# Patient Record
Sex: Female | Born: 1975 | Race: White | Hispanic: Yes | Marital: Married | Smoking: Never smoker
Health system: Southern US, Community
[De-identification: ages and names within clinical notes are randomized; demographics above are authoritative.]

## PROBLEM LIST (undated history)

## (undated) ENCOUNTER — Inpatient Hospital Stay (HOSPITAL_COMMUNITY): Payer: Self-pay

## (undated) DIAGNOSIS — Z789 Other specified health status: Secondary | ICD-10-CM

## (undated) HISTORY — PX: LAPAROSCOPIC ENDOMETRIOSIS FULGURATION: SUR769

## (undated) HISTORY — PX: OOPHORECTOMY: SHX86

---

## 2000-03-27 ENCOUNTER — Emergency Department (HOSPITAL_COMMUNITY): Admission: EM | Admit: 2000-03-27 | Discharge: 2000-03-27 | Payer: Self-pay | Admitting: Emergency Medicine

## 2002-01-10 ENCOUNTER — Inpatient Hospital Stay (HOSPITAL_COMMUNITY): Admission: AD | Admit: 2002-01-10 | Discharge: 2002-01-10 | Payer: Self-pay | Admitting: *Deleted

## 2008-06-23 ENCOUNTER — Emergency Department (HOSPITAL_COMMUNITY): Admission: EM | Admit: 2008-06-23 | Discharge: 2008-06-23 | Payer: Self-pay | Admitting: Emergency Medicine

## 2008-08-22 ENCOUNTER — Inpatient Hospital Stay (HOSPITAL_COMMUNITY): Admission: AD | Admit: 2008-08-22 | Discharge: 2008-08-22 | Payer: Self-pay | Admitting: Obstetrics & Gynecology

## 2008-09-20 ENCOUNTER — Ambulatory Visit: Payer: Self-pay | Admitting: Obstetrics and Gynecology

## 2008-09-20 LAB — CONVERTED CEMR LAB: Anticardiolipin IgA: 26 (ref <13)

## 2008-10-04 ENCOUNTER — Ambulatory Visit: Payer: Self-pay | Admitting: Family Medicine

## 2008-10-05 ENCOUNTER — Encounter: Payer: Self-pay | Admitting: Obstetrics and Gynecology

## 2008-10-05 LAB — CONVERTED CEMR LAB
AntiThromb III Func: 99 % (ref 76–126)
Anticardiolipin IgM: <7 (ref <10)
HCT: 33.2 % — ABNORMAL LOW (ref 36.0–46.0)
Homocysteine: 7.2 micromoles/L (ref 4.0–15.4)
MCHC: 30.1 g/dL (ref 30.0–36.0)
MCV: 87.4 fL (ref 78.0–100.0)
Platelets: 321 10*3/uL (ref 150–400)
Protein S Ag, Total: 156 % — ABNORMAL HIGH (ref 70–140)
RBC: 3.8 M/uL — ABNORMAL LOW (ref 3.87–5.11)
WBC: 6.1 10*3/uL (ref 4.0–10.5)

## 2008-10-25 ENCOUNTER — Ambulatory Visit: Payer: Self-pay | Admitting: Obstetrics and Gynecology

## 2008-12-20 ENCOUNTER — Encounter: Payer: Self-pay | Admitting: Obstetrics & Gynecology

## 2008-12-20 ENCOUNTER — Ambulatory Visit: Payer: Self-pay | Admitting: Obstetrics and Gynecology

## 2008-12-22 ENCOUNTER — Encounter: Payer: Self-pay | Admitting: Physician Assistant

## 2009-01-10 ENCOUNTER — Ambulatory Visit: Payer: Self-pay | Admitting: Obstetrics & Gynecology

## 2009-02-16 ENCOUNTER — Emergency Department (HOSPITAL_COMMUNITY): Admission: EM | Admit: 2009-02-16 | Discharge: 2009-02-16 | Payer: Self-pay | Admitting: Emergency Medicine

## 2009-02-27 ENCOUNTER — Encounter: Payer: Self-pay | Admitting: Obstetrics & Gynecology

## 2009-02-28 ENCOUNTER — Ambulatory Visit: Payer: Self-pay | Admitting: Obstetrics & Gynecology

## 2009-02-28 ENCOUNTER — Ambulatory Visit (HOSPITAL_COMMUNITY): Admission: RE | Admit: 2009-02-28 | Discharge: 2009-02-28 | Payer: Self-pay | Admitting: Obstetrics & Gynecology

## 2010-01-31 ENCOUNTER — Encounter: Payer: Self-pay | Admitting: Family Medicine

## 2010-01-31 ENCOUNTER — Ambulatory Visit (HOSPITAL_COMMUNITY): Admission: AD | Admit: 2010-01-31 | Discharge: 2010-01-31 | Payer: Self-pay | Admitting: Family Medicine

## 2010-01-31 ENCOUNTER — Ambulatory Visit: Payer: Self-pay | Admitting: Obstetrics & Gynecology

## 2010-02-10 DEATH — deceased

## 2010-02-14 ENCOUNTER — Ambulatory Visit: Payer: Self-pay | Admitting: Obstetrics and Gynecology

## 2010-09-27 LAB — CBC
HCT: 34.3 % — ABNORMAL LOW (ref 36.0–46.0)
MCH: 30.3 pg (ref 26.0–34.0)
MCV: 90.1 fL (ref 78.0–100.0)
RBC: 3.81 MIL/uL — ABNORMAL LOW (ref 3.87–5.11)
WBC: 7.1 10*3/uL (ref 4.0–10.5)

## 2010-09-27 LAB — DIFFERENTIAL
Basophils Relative: 0 % (ref 0–1)
Lymphocytes Relative: 19 % (ref 12–46)
Monocytes Absolute: 0.5 10*3/uL (ref 0.1–1.0)
Neutro Abs: 5.1 10*3/uL (ref 1.7–7.7)
Neutrophils Relative %: 73 % (ref 43–77)

## 2010-09-27 LAB — URINALYSIS, ROUTINE W REFLEX MICROSCOPIC: Glucose, UA: NEGATIVE mg/dL

## 2010-09-27 LAB — URINE CULTURE

## 2010-09-27 LAB — URINE MICROSCOPIC-ADD ON

## 2010-09-27 LAB — GC/CHLAMYDIA PROBE AMP, GENITAL: Chlamydia, DNA Probe: NEGATIVE

## 2010-09-27 LAB — WET PREP, GENITAL

## 2010-10-18 LAB — COMPREHENSIVE METABOLIC PANEL
ALT: 9 U/L (ref 0–35)
Albumin: 3.9 g/dL (ref 3.5–5.2)
BUN: 9 mg/dL (ref 6–23)
Chloride: 110 mEq/L (ref 96–112)
GFR calc Af Amer: >60 mL/min (ref >60)
GFR calc non Af Amer: >60 mL/min (ref >60)
Glucose, Bld: 97 mg/dL (ref 70–99)
Potassium: 3 mEq/L — ABNORMAL LOW (ref 3.5–5.1)
Sodium: 138 mEq/L (ref 135–145)
Total Protein: 7.2 g/dL (ref 6.0–8.3)

## 2010-10-18 LAB — CBC
HCT: 31.4 % — ABNORMAL LOW (ref 36.0–46.0)
HCT: 29.7 % — ABNORMAL LOW (ref 36.0–46.0)
Hemoglobin: 10.5 g/dL — ABNORMAL LOW (ref 12.0–15.0)
Hemoglobin: 9.8 g/dL — ABNORMAL LOW (ref 12.0–15.0)
MCHC: 33.3 g/dL (ref 30.0–36.0)
MCHC: 32.8 g/dL (ref 30.0–36.0)
RBC: 4.01 MIL/uL (ref 3.87–5.11)
RDW: 16.2 % — ABNORMAL HIGH (ref 11.5–15.5)

## 2010-10-18 LAB — DIFFERENTIAL
Eosinophils Absolute: 0 10*3/uL (ref 0.0–0.7)
Lymphs Abs: 1.5 10*3/uL (ref 0.7–4.0)
Monocytes Absolute: 0.4 10*3/uL (ref 0.1–1.0)
Monocytes Relative: 7 % (ref 3–12)
Neutro Abs: 3.2 10*3/uL (ref 1.7–7.7)

## 2010-10-18 LAB — PREGNANCY, URINE: Preg Test, Ur: NEGATIVE

## 2010-10-18 LAB — CK TOTAL AND CKMB (NOT AT ARMC): Relative Index: INVALID (ref 0.0–2.5)

## 2010-10-18 LAB — GLUCOSE, CAPILLARY: Glucose-Capillary: 89 mg/dL (ref 70–99)

## 2010-10-23 LAB — POCT URINALYSIS DIP (DEVICE)
Bilirubin Urine: NEGATIVE
Ketones, ur: NEGATIVE mg/dL
Protein, ur: NEGATIVE mg/dL
pH: 5 (ref 5.0–8.0)

## 2010-10-28 LAB — WET PREP, GENITAL: Clue Cells Wet Prep HPF POC: NONE SEEN

## 2010-10-28 LAB — CBC
MCV: 89.2 fL (ref 78.0–100.0)
Platelets: 257 10*3/uL (ref 150–400)
WBC: 8.9 10*3/uL (ref 4.0–10.5)

## 2010-10-28 LAB — GC/CHLAMYDIA PROBE AMP, GENITAL
Chlamydia, DNA Probe: NEGATIVE
GC Probe Amp, Genital: NEGATIVE

## 2010-10-28 LAB — URINE MICROSCOPIC-ADD ON

## 2010-10-28 LAB — URINALYSIS, ROUTINE W REFLEX MICROSCOPIC
Bilirubin Urine: NEGATIVE
Glucose, UA: NEGATIVE mg/dL
Specific Gravity, Urine: >1.03 — ABNORMAL HIGH (ref 1.005–1.030)
pH: 6 (ref 5.0–8.0)

## 2010-10-28 LAB — COMPREHENSIVE METABOLIC PANEL
ALT: 10 U/L (ref 0–35)
AST: 15 U/L (ref 0–37)
Albumin: 3.9 g/dL (ref 3.5–5.2)
Chloride: 109 mEq/L (ref 96–112)
Creatinine, Ser: 0.57 mg/dL (ref 0.4–1.2)
GFR calc Af Amer: >60 mL/min (ref >60)
Sodium: 138 mEq/L (ref 135–145)
Total Bilirubin: 0.4 mg/dL (ref 0.3–1.2)

## 2010-10-28 LAB — URINE CULTURE: Colony Count: 3000

## 2010-11-25 NOTE — Group Therapy Note (Signed)
NAME:  Kayla Strickland, GOLL NO.:  [id number]   MEDICAL RECORD NO.:  [id number]          PATIENT TYPE:  WOC   LOCATION:  [facility]                   FACILITY:  [facility]   PHYSICIAN:  Argentina Donovan, MD        DATE OF BIRTH:  21-Feb-1976   DATE OF SERVICE:  10/25/2008                                  CLINIC NOTE   The patient is a 35 year old Spanish-speaking Hispanic female who does  speak some English who was seen and worked up for habitual abortion and  pelvic pain.  She is a gravida 5, para 0-0-5-0, all spontaneous  miscarriages.  An ultrasound was normal with the exception of some  thickening of the anterior midportion of the uterus and had ill-defined  margins, possibly adenomyosis, but the uterus they did see was slightly  enlarged and measuring 11 x 5.6 x 9.  She had an antiphospholipid  workup, which was essentially negative with the exception of a slightly  elevated cardiolipin IgA antibody.  We are going to try on some Depo-  Provera for 3 months to see if that helps her pelvic pain.  If not, I  think laparoscopy will be carried out.  If it has helped, then I would  give her another 62-month injection as it is certainly possible this  patient does have endometriosis.   IMPRESSION:  Chronic pelvic pain of unknown etiology.  Slightly enlarged  uterus.  Habitual abortion.           ______________________________  Argentina Donovan, MD     PR/MEDQ  D:  10/25/2008  T:  10/25/2008  Job:  [id number]

## 2010-11-25 NOTE — Op Note (Signed)
NAMEMarland Kitchen  Kayla, Strickland        ACCOUNT NO.:  [id number]   MEDICAL RECORD NO.:  [id number]          PATIENT TYPE:  AMB   LOCATION:  DAY                           [facility]   PHYSICIAN:  Kayla Bossier, MD        DATE OF BIRTH:  02/24/1976   DATE OF PROCEDURE:  DATE OF DISCHARGE:                               OPERATIVE REPORT   PREOPERATIVE DIAGNOSES:  1. Chronic pelvic pain.  2. Dyspareunia.  3. Dysfunctional uterine bleeding and anemia.   POSTOPERATIVE DIAGNOSES:  1. Chronic pelvic pain.  2. Dyspareunia.  3. Dysfunctional uterine bleeding and anemia.  4. Probable endometriosis.   SURGEON:  M.C. Marice Potter, MD   ANESTHESIA:  GETR general anesthesia, Cristela Blue, MD   PROCEDURE:  1. Diagnostic laparoscopy with biopsy of posterior cul-de-sac.  2. Dilation curettage.   DETAILED PROCEDURE AND FINDINGS:  [name] is a 34 year old divorced  Hispanic, gravida 5, para 0, abortus 5, with habitual spontaneous  abortions.  She also has a 5 year history of chronic pelvic pain with  the right being worse than the left.  She also reports dyspareunia and  dysfunctional uterine bleeding.  Her bleeding has become so heavy that  her hemoglobin has dropped to 10.5.  She is currently being treated with  Depo-Provera.  Unfortunately the Depo does not make her pain any better.  She was counseled about a workup for her pain  and bleeding including a  laparoscopy and a D&C.  She understands the risks associated with  surgery and all questions were answered.  Consents were signed.  She was  taken to the operating room and placed in dorsal lithotomy position.  General anesthesia was applied without complication.  Her abdomen and  vagina were prepped and draped in the usual sterile fashion.  Her  bladder was emptied with a Robinson catheter.  Bimanual exam revealed a  mobile, anteverted uterus and nonenlarged adnexa.  Speculum was placed  in the vagina and the Hulka manipulator was placed on the  cervix for  uterine manipulation with the aid of a tenaculum.  Gloves were changed.  Attention was turned to the abdomen.  A vertical umbilical incision was  made and a Veress needle was placed intraperitoneally.  Low-flow CO2 was  used to insufflate the abdomen to approximately 3 liters.  A good  pneumoperitoneum was established.  The patient's abdominal pressure was  always less than 12.  A 10-mm trocar was placed.  Laparoscopy confirmed  correct placement.  She was placed in Trendelenburg position.  Her  pelvis was inspected.  The upper abdomen appeared normal.  The most  distinctive findings on her laparoscopy was:  1.  Normal-appearing  uterus.  2.  Normal-appearing ovaries and oviducts.  3.  Multiple  peritoneal defects certainly.  The cul-de-sac had one large perineal  defects on either side and then in the area on the outside of  uterosacral ligament, there were multiple peritoneal defects.  There was  also large dilated pelvic veins.  The ovarian fossae both appeared  entirely normal.  The edge of the peritoneal defect in the posterior cul-  de-sac on the patient's left was biopsied.  There was no bleeding from  this site.  Please note that a 5-mm ports had been placed in the left  lower quadrant under direct laparoscopic visualization, taking care to  avoid the inferior epigastric vessels.  At the completion of the  inspection of the pelvis and biopsy of the cul-de-sac, the 5-mm port was  removed.  No bleeding was noted from the site.  The CO2 was allowed to  escape from the abdomen.  Both incision sites were injected with 0.5%  Marcaine, total of 10 mL was used.  The fascia in the umbilicus was  elevated with forceps and closed with a 0 Vicryl figure-of-eight suture.  A subcuticular closure was done in both sites with a 4-0 Vicryl suture.  The tenaculum was removed and I proceeded with the D&C.  Speculum was  replaced.  Tenaculum was replaced.  The Hulka manipulator was  removed.  The cervix was gently dilated to accommodate a small curette.  Sharp  curettage was done in all quadrants of the fundus of the uterus.  A  gritty sensation was appreciated.  Only a small amount of tissue was  obtained.  (Please note, the patient has been on Depo-Provera).  Please  note, the uterus sounded to 8 cm and felt entirely normal during the  curettage process.  The tenaculum was removed and a total of 10 mL of 1%  lidocaine was injected for a paracervical block to help with postop pain  relief.  She was extubated and taken to recovery room in stable  condition.  Instrument, sponge and counts were correct.  She tolerated  the procedure well.      Kayla Bossier, MD  Electronically Signed     MCD/MEDQ  D:  02/28/2009  T:  02/28/2009  Job:  [id number]
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
## 2010-11-25 NOTE — Group Therapy Note (Signed)
NAME:  Kayla Strickland, Kayla Strickland NO.:  [id number]   MEDICAL RECORD NO.:  [id number]          PATIENT TYPE:  WOC   LOCATION:  [facility]                   FACILITY:  [facility]   PHYSICIAN:  Allie Bossier, MD        DATE OF BIRTH:  1976/05/05   DATE OF SERVICE:                                  CLINIC NOTE   Kayla Strickland is a 35 year old married Hispanic, gravida 5, para 0, abortus 5,  who has been seen here in the GYN Clinic for her diagnosis of habitual  abortion.  Workup today has been negative.  However, she is here today  as a followup for an MAU visit on August 22, 2008.  She has been  complaining of pelvic pain for approximately 5 years, it is essentially  always in the right lower quadrant.  She describes the pain is stabbing.  Ibuprofen helps the pain a little bit, but not to any great degree.  The  pain occurs every day.  She has pain with intercourse and ultrasound was  done on October 03, 2007 that showed the uterus measuring a 11 x 7 cm with  the appearance of possible adenomyosis.  There was also a septated right  ovarian cyst seen at that time.  A comparison ultrasound done at the  time of her MAU visit on August 22, 2008, showed the uterus to be  slightly smaller at 9.6 cm in its greatest dimension along with  possibility of adenomyosis.  The right and left ovaries appeared normal.  There was no free fluid in the pelvis.  Her white count at that time was  8.9 and a normal hemoglobin of 11.9.  When she was seen here in the GYN  clinic on October 25, 2008, she was seen by Dr. Tenny Craw, and he felt that she  probably has endometriosis or adenomyosis, and he went ahead and gave  her Depo-Provera injection that day to see if it helped her pain.  She  says that her pain is not any better.  She also describes that prior to  the Depo injection and certainly after the Depo injection, her periods  are abnormal.  She can bleed every day for 14 days as she has this month  or she can skip  several weeks, but she generally has never had a normal  cycle.  On exam, her cervix shows some evidence of moderate amount of  bleeding.  A Pap smear was obtained as well as cervical cultures today.   ASSESSMENT AND PLAN:  Chronic right lower quadrant pain, habitual  abortion, dysfunctional uterine bleeding, possible adenomyosis and  endometriosis.  I have recommended a laparoscopy along with a D and C.  She is excited to have workup started for her pain and bleeding and has  no questions.      Allie Bossier, MD     MCD/MEDQ  D:  12/20/2008  T:  12/21/2008  Job:  [id number]

## 2010-11-25 NOTE — Consult Note (Signed)
NAME:  Kayla Strickland, Kayla Strickland NO.:  [id number]   MEDICAL RECORD NO.:  [id number]          PATIENT TYPE:  EMS   LOCATION:  [facility]                         FACILITY:  [facility]   PHYSICIAN:  Noel Christmas, MD    DATE OF BIRTH:  Aug 29, 1975   DATE OF CONSULTATION:  02/16/2009  DATE OF DISCHARGE:  02/16/2009                                 CONSULTATION   REFERRING PHYSICIAN:  Nelva Nay, MD, [facility].   REASON FOR CONSULTATION:  Rule out TIA or stroke, presenting as code  stroke.   HISTORY OF PRESENT ILLNESS:  This is a 35 year old Hispanic lady who was  brought to the emergency room after being found in walk-in freezer at  work with a complaint of not being able to move her left arm and to a  lesser extent her left leg.  The patient stated that she felt  lightheaded and felt like she had difficulty breathing prior to going  into the freezer.  She has not had headache.  There have been no speech  changes.  No previous history of stroke or TIA.  The patient indicated  she has had problems with low blood pressure.  She is on no medications  at this point and took no medications of any type today.  CT scan of her  head was unremarkable with no signs of acute intracranial abnormality.   PAST MEDICAL HISTORY:  Remarkable for chronic pelvic pain with  dysmenorrhea as well as dyspareunia.  The patient is gravida 5, para 0,  abortus 5.   CURRENT MEDICATIONS:  None.   FAMILY HISTORY:  Noncontributory.   PHYSICAL EXAMINATION:  Appearance was that of a young lady of slender to  medium build who was alert and moderately cooperative.  She appeared to  be in no acute distress.  She was moderately etches as well as  irritable.  She was well oriented to time as well as place.  Short-term  and long-term memory were normal.  The patient has no speech deficit.  Pupils were equal and reactive normally to light.  Extraocular movements  were full and conjugate.  Visual fields  were intact and normal.  There  was no facial weakness.  She had reduced perception of all modalities of  sensation over the left side of her face compared to the right.  Hearing  was normal.  Speech was normal.  Coordination of upper extremities as  well as lower extremities was normal.  Strength was normal with good  equal purposeful movements of both upper extremities.  Deep tendon  reflexes were normal and symmetrical throughout.  Plantar responses were  flexor.  Sensory examination is normal except for reduction of  perception of all modalities involving the left side compared to the  right.  Carotid auscultation was normal.   CLINICAL IMPRESSION:  1. No objective indications of an acute focal neurological deficit      including exam, findings, and CT scan of her head.  2. Probable acute psychophysiologic reaction.   RECOMMENDATIONS:  1. No further neurodiagnostic studies are indicated at this point.  2. We will review the routine  laboratory studies when available.  3. Neurology reevaluation on a p.r.n. basis.   Thank you for asking me to evaluate Ms. Robb Matar.      Noel Christmas, MD  Electronically Signed     CS/MEDQ  D:  02/16/2009  T:  02/16/2009  Job:  [id number]

## 2011-03-11 ENCOUNTER — Other Ambulatory Visit: Payer: Self-pay | Admitting: Obstetrics

## 2011-03-11 DIAGNOSIS — O2620 Pregnancy care for patient with recurrent pregnancy loss, unspecified trimester: Secondary | ICD-10-CM

## 2011-03-23 ENCOUNTER — Ambulatory Visit (HOSPITAL_COMMUNITY)
Admission: RE | Admit: 2011-03-23 | Discharge: 2011-03-23 | Disposition: A | Discharge status: Home / Self Care | Payer: Self-pay | Source: Ambulatory Visit | Attending: Obstetrics | Admitting: Obstetrics

## 2011-03-23 ENCOUNTER — Encounter (HOSPITAL_COMMUNITY): Payer: Self-pay

## 2011-03-23 DIAGNOSIS — Z1389 Encounter for screening for other disorder: Secondary | ICD-10-CM | POA: Insufficient documentation

## 2011-03-23 DIAGNOSIS — O358XX0 Maternal care for other (suspected) fetal abnormality and damage, not applicable or unspecified: Secondary | ICD-10-CM | POA: Insufficient documentation

## 2011-03-23 DIAGNOSIS — O2620 Pregnancy care for patient with recurrent pregnancy loss, unspecified trimester: Secondary | ICD-10-CM

## 2011-03-23 DIAGNOSIS — Z363 Encounter for antenatal screening for malformations: Secondary | ICD-10-CM | POA: Insufficient documentation

## 2011-03-23 NOTE — Progress Notes (Signed)
MFM CONSULT   Patient is a 35 y/o G8 P0070 with a history of 7 first trimester SAB and a prior ectopic pregnancy.  She presents for recommendations for evaluation and management of current pregnancy.    Patient had no complaints today and was feeling well.  She reported some spotting and cramping during the first trimester of this gestation.  This has since resolved.    Patient has a history of 6 first trimester pregnancy losses.  These were all managed expectantly.  She reports confirmed cardiac activity with at least 2 of these gestations.  No records were available for review today related to these pregnancies, but the patient denies any knowledge of a prior work up for this.  She also denies any karyotype analysis for any of the POC of these gestations.  She does report being told once endometriosis or fibroids may have been related to these losses.    She has no other significant medical or surgical history.  She does report her ectopic pregnancy to have been treated with a left salpingectomy.  She is currently taking no medications other than prenatal vitamins.  There is no other significant family history for either the patient or the FOB.  With the exception of her ectopic pregnancy, all prior pregnancies have been with a prior partner.   Patient underwent an OB ultrasound today with reassuring findings and confirmation of a gestational age of [redacted] weeks (see [facility] report).  An echogenic foci was seen. No fibroids were noted on today's exam.   Options for work up of recurrent pregnancy loss was discussed today.  She elected to proceed with ACLAB work up and [employer].  These were drawn today. Maternal peripheral blood chromosome analysis was offered, but she declined this.  She may desire to consider this testing if today's evaluations are normal.  Additionally, pending the outcome of this pregnancy, HSG could be considered in this setting if these other elements of the her work up are WNL.    Reassurance was provided regarding ultrasound findings today, but periodic ultrasounds every 6-8 weeks through the remainder of the pregnancy were recommended to re evaluate fetal growth, anatomy, and amniotic fluid.    The option of amniocentesis for determination of fetal karyotype was also briefly discussed today.  Patient declined this and desires to await the results of today's tests before deciding on any further evaluations.   Questions answered.     (30 min)

## 2011-03-25 ENCOUNTER — Other Ambulatory Visit: Payer: Self-pay | Admitting: Obstetrics

## 2011-04-02 LAB — RPR: RPR: NONREACTIVE

## 2011-04-02 LAB — HEPATITIS B SURFACE ANTIGEN: Hepatitis B Surface Ag: NEGATIVE

## 2011-04-02 LAB — HIV ANTIBODY (ROUTINE TESTING W REFLEX): HIV: NONREACTIVE

## 2011-04-16 LAB — POCT I-STAT, CHEM 8
BUN: 9 mg/dL (ref 6–23)
Calcium, Ion: 1.17 mmol/L (ref 1.12–1.32)
HCT: 35 % — ABNORMAL LOW (ref 36.0–46.0)
TCO2: 24 mmol/L (ref 0–100)

## 2011-04-16 LAB — URINALYSIS, ROUTINE W REFLEX MICROSCOPIC
Glucose, UA: NEGATIVE mg/dL
pH: 6.5 (ref 5.0–8.0)

## 2011-04-16 LAB — CBC
HCT: 35 % — ABNORMAL LOW (ref 36.0–46.0)
Hemoglobin: 11.8 g/dL — ABNORMAL LOW (ref 12.0–15.0)
MCHC: 33.7 g/dL (ref 30.0–36.0)
MCV: 91.5 fL (ref 78.0–100.0)
RDW: 14 % (ref 11.5–15.5)

## 2011-04-16 LAB — URINE CULTURE

## 2011-04-16 LAB — URINE MICROSCOPIC-ADD ON

## 2011-04-16 LAB — DIFFERENTIAL
Basophils Absolute: 0 10*3/uL (ref 0.0–0.1)
Basophils Relative: 1 % (ref 0–1)
Eosinophils Relative: 1 % (ref 0–5)
Monocytes Absolute: 0.4 10*3/uL (ref 0.1–1.0)

## 2011-04-16 LAB — WET PREP, GENITAL
Trich, Wet Prep: NONE SEEN
Yeast Wet Prep HPF POC: NONE SEEN

## 2011-04-16 LAB — GC/CHLAMYDIA PROBE AMP, GENITAL: GC Probe Amp, Genital: NEGATIVE

## 2011-05-04 ENCOUNTER — Ambulatory Visit (HOSPITAL_COMMUNITY)
Admission: RE | Admit: 2011-05-04 | Discharge: 2011-05-04 | Disposition: A | Discharge status: Home / Self Care | Payer: Self-pay | Source: Ambulatory Visit | Attending: Obstetrics | Admitting: Obstetrics

## 2011-05-04 DIAGNOSIS — O2620 Pregnancy care for patient with recurrent pregnancy loss, unspecified trimester: Secondary | ICD-10-CM

## 2011-05-04 DIAGNOSIS — Z3689 Encounter for other specified antenatal screening: Secondary | ICD-10-CM | POA: Insufficient documentation

## 2011-08-11 ENCOUNTER — Encounter (HOSPITAL_COMMUNITY): Payer: Self-pay | Admitting: *Deleted

## 2011-08-11 ENCOUNTER — Inpatient Hospital Stay (HOSPITAL_COMMUNITY)
Admission: AD | Admit: 2011-08-11 | Discharge: 2011-08-11 | Disposition: A | Discharge status: Home / Self Care | Payer: Self-pay | Source: Ambulatory Visit | Attending: Obstetrics | Admitting: Obstetrics

## 2011-08-11 DIAGNOSIS — O479 False labor, unspecified: Secondary | ICD-10-CM | POA: Insufficient documentation

## 2011-08-11 NOTE — Progress Notes (Signed)
Dr. Gaynell Face notified of pt presenting for labor check.  Notified of VE and ctx pattern.  Notified of reactive fetal strip.  Orders to dc home.

## 2011-08-11 NOTE — Progress Notes (Signed)
Pt presents to mau for labor check.  Ctx started around 0000 and became worse around 2am.

## 2011-08-11 NOTE — Discharge Instructions (Signed)
Return to MAU with any worsening symptoms.  Call your doctor with any concerns or questions.  

## 2011-08-20 ENCOUNTER — Other Ambulatory Visit: Payer: Self-pay | Admitting: Obstetrics

## 2011-08-21 ENCOUNTER — Encounter (HOSPITAL_COMMUNITY): Payer: Self-pay | Admitting: *Deleted

## 2011-08-21 ENCOUNTER — Telehealth (HOSPITAL_COMMUNITY): Payer: Self-pay | Admitting: *Deleted

## 2011-08-21 ENCOUNTER — Inpatient Hospital Stay (HOSPITAL_COMMUNITY)
Admission: AD | Admit: 2011-08-21 | Discharge: 2011-08-25 | DRG: 765 | Disposition: A | Discharge status: Home / Self Care | Payer: Medicaid Other | Source: Ambulatory Visit | Attending: Obstetrics & Gynecology | Admitting: Obstetrics & Gynecology

## 2011-08-21 DIAGNOSIS — O411290 Chorioamnionitis, unspecified trimester, not applicable or unspecified: Secondary | ICD-10-CM | POA: Diagnosis not present

## 2011-08-21 DIAGNOSIS — O480 Post-term pregnancy: Principal | ICD-10-CM | POA: Diagnosis present

## 2011-08-21 DIAGNOSIS — O411090 Infection of amniotic sac and membranes, unspecified, unspecified trimester, not applicable or unspecified: Secondary | ICD-10-CM | POA: Diagnosis present

## 2011-08-21 DIAGNOSIS — O8612 Endometritis following delivery: Secondary | ICD-10-CM | POA: Diagnosis not present

## 2011-08-21 DIAGNOSIS — O368190 Decreased fetal movements, unspecified trimester, not applicable or unspecified: Secondary | ICD-10-CM | POA: Diagnosis not present

## 2011-08-21 LAB — CBC
Hemoglobin: 12.8 g/dL (ref 12.0–15.0)
MCH: 32.9 pg (ref 26.0–34.0)
MCHC: 33.7 g/dL (ref 30.0–36.0)
RDW: 13.2 % (ref 11.5–15.5)

## 2011-08-21 LAB — RPR: RPR Ser Ql: NONREACTIVE

## 2011-08-21 MED ORDER — OXYTOCIN 20 UNITS IN LACTATED RINGERS INFUSION - SIMPLE
1.0000 m[IU]/min | INTRAVENOUS | Status: DC
  Administered 2011-08-22: 1 m[IU]/min via INTRAVENOUS
  Filled 2011-08-21: qty 1000

## 2011-08-21 MED ORDER — OXYCODONE-ACETAMINOPHEN 5-325 MG PO TABS
1.0000 | ORAL_TABLET | ORAL | Status: DC | PRN
  Administered 2011-08-22: 2 via ORAL
  Filled 2011-08-21: qty 2

## 2011-08-21 MED ORDER — ACETAMINOPHEN 325 MG PO TABS
650.0000 mg | ORAL_TABLET | ORAL | Status: DC | PRN
  Administered 2011-08-22 (×2): 650 mg via ORAL
  Filled 2011-08-21 (×2): qty 2

## 2011-08-21 MED ORDER — FLEET ENEMA 7-19 GM/118ML RE ENEM: 1.0000 | ENEMA | RECTAL | Status: DC | PRN

## 2011-08-21 MED ORDER — ZOLPIDEM TARTRATE 10 MG PO TABS
10.0000 mg | ORAL_TABLET | Freq: Every evening | ORAL | Status: DC | PRN
  Administered 2011-08-21: 10 mg via ORAL
  Filled 2011-08-21: qty 1

## 2011-08-21 MED ORDER — CITRIC ACID-SODIUM CITRATE 334-500 MG/5ML PO SOLN
30.0000 mL | ORAL | Status: DC | PRN
  Administered 2011-08-22: 30 mL via ORAL
  Filled 2011-08-21: qty 15

## 2011-08-21 MED ORDER — OXYTOCIN 20 UNITS IN LACTATED RINGERS INFUSION - SIMPLE: 125.0000 mL/h | Freq: Once | INTRAVENOUS | Status: DC

## 2011-08-21 MED ORDER — LACTATED RINGERS IV SOLN
500.0000 mL | INTRAVENOUS | Status: DC | PRN
  Administered 2011-08-21: 1000 mL via INTRAVENOUS
  Administered 2011-08-22 (×2): 500 mL via INTRAVENOUS
  Administered 2011-08-22 (×2): 1000 mL via INTRAVENOUS

## 2011-08-21 MED ORDER — SODIUM CHLORIDE 0.9 % IV SOLN: 250.0000 mL | INTRAVENOUS | Status: DC | PRN

## 2011-08-21 MED ORDER — SODIUM CHLORIDE 0.9 % IJ SOLN: 3.0000 mL | Freq: Two times a day (BID) | INTRAMUSCULAR | Status: DC

## 2011-08-21 MED ORDER — IBUPROFEN 600 MG PO TABS: 600.0000 mg | ORAL_TABLET | Freq: Four times a day (QID) | ORAL | Status: DC | PRN

## 2011-08-21 MED ORDER — ONDANSETRON HCL 4 MG/2ML IJ SOLN: 4.0000 mg | Freq: Four times a day (QID) | INTRAMUSCULAR | Status: DC | PRN

## 2011-08-21 MED ORDER — SODIUM CHLORIDE 0.9 % IJ SOLN: 3.0000 mL | INTRAMUSCULAR | Status: DC | PRN

## 2011-08-21 MED ORDER — LACTATED RINGERS IV SOLN
INTRAVENOUS | Status: DC
  Administered 2011-08-21 – 2011-08-22 (×4): via INTRAVENOUS
  Administered 2011-08-22: 125 mL/h via INTRAVENOUS

## 2011-08-21 MED ORDER — TERBUTALINE SULFATE 1 MG/ML IJ SOLN: 0.2500 mg | Freq: Once | INTRAMUSCULAR | Status: AC | PRN

## 2011-08-21 MED ORDER — LIDOCAINE HCL (PF) 1 % IJ SOLN: 30.0000 mL | INTRAMUSCULAR | Status: DC | PRN

## 2011-08-21 MED ORDER — MISOPROSTOL 25 MCG QUARTER TABLET
25.0000 ug | ORAL_TABLET | ORAL | Status: DC | PRN
  Administered 2011-08-21 (×2): 25 ug via VAGINAL
  Filled 2011-08-21 (×2): qty 0.25

## 2011-08-21 MED ORDER — OXYTOCIN BOLUS FROM INFUSION
500.0000 mL | Freq: Once | INTRAVENOUS | Status: DC
  Filled 2011-08-21: qty 500

## 2011-08-21 MED ORDER — DINOPROSTONE 10 MG VA INST
10.0000 mg | VAGINAL_INSERT | Freq: Once | VAGINAL | Status: DC
  Filled 2011-08-21: qty 1

## 2011-08-21 NOTE — Telephone Encounter (Signed)
Preadmission screen  

## 2011-08-21 NOTE — Progress Notes (Signed)
Dr Clearance Coots called in, pt for monitoring.  No bleeding or leaking.  Pain in lower abd, cramping for past 5 days, nothing consistant.  Pt taken directly to rm to be placed on monitor and assessed.

## 2011-08-21 NOTE — H&P (Signed)
Kayla Strickland is a 36 y.o. year old G72P0070 female at [redacted]w[redacted]d weeks gestation who presents to MAU reporting decreased fetal movement. Maternal Medical History:  Reason for admission: Reason for admission: nausea.  Decreased fetal movement  Contractions: none  Fetal activity: Perceived fetal activity is decreased.   Last perceived fetal movement was within the past 12 hours.    Prenatal complications: no prenatal complications Prenatal Complications - Diabetes: none.    OB History    Grav Para Term Preterm Abortions TAB SAB Ect Mult Living   8 0 0 0 7 0 6 1 0 0      Past Medical History  Diagnosis Date  . Endometriosis    Past Surgical History  Procedure Date  . Oophorectomy   . Laparoscopic endometriosis fulguration    Family History: family history is not on file. Social History:  does not have a smoking history on file. She does not have any smokeless tobacco history on file. She reports that she does not drink alcohol or use illicit drugs.  Review of Systems  Constitutional: Negative for fever and malaise/fatigue.  Eyes: Negative for blurred vision.  Respiratory: Negative for shortness of breath.   Cardiovascular: Negative for chest pain.  Gastrointestinal: Positive for nausea. Negative for vomiting.  Musculoskeletal: Negative for back pain.  Neurological: Positive for headaches (c/o intermittent headaches). Negative for dizziness.      Blood pressure 113/78, pulse 77, temperature 97.5 F (36.4 C), temperature source Oral, resp. rate 18, last menstrual period 11/10/2010. Maternal Exam:  Uterine Assessment: Contraction strength is mild.  Contraction frequency is irregular.  Occasional irregular contraction  Abdomen: Patient reports no abdominal tenderness. Estimated fetal weight is 7lbs.   Fetal presentation: vertex  Introitus: Normal vulva. Normal vagina.  Pelvis: adequate for delivery.   Cervix: Cervix evaluated by digital exam.   FT/50%/-2 posterior,  soft  Fetal Exam Fetal Monitor Review: Mode: ultrasound.   Variability: moderate (6-25 bpm).   Pattern: accelerations present and no decelerations.   10x10 accels noted.   Fetal State Assessment: Category I - tracings are normal.     Physical Exam  Constitutional: She is oriented to person, place, and time. She appears well-developed and well-nourished.  HENT:  Head: Normocephalic.  Eyes: Pupils are equal, round, and reactive to light.  Neck: Normal range of motion.  Cardiovascular: Normal rate and regular rhythm.   Respiratory: Effort normal and breath sounds normal.  GI: Soft. Bowel sounds are normal.  Genitourinary: Vagina normal and uterus normal.  Musculoskeletal: Normal range of motion.  Neurological: She is alert and oriented to person, place, and time. She has normal reflexes.  Skin: Skin is warm and dry.  Psychiatric: She has a normal mood and affect. Her behavior is normal. Judgment and thought content normal.    Prenatal labs: ABO, Rh:   Antibody: Negative (09/20 0000) Rubella: Immune (09/20 0000) RPR: Nonreactive (09/20 0000)  HBsAg: Negative (09/20 0000)  HIV:    GBS: Negative (01/10 0000)   Assessment/Plan: Admit for induction of labor due to postdates and decreased fetal movement. Cytotec q4hours, Consult with Dr. Clearance Coots.    HAMBY, Dajuana Palen 08/21/2011, 9:52 AM

## 2011-08-21 NOTE — Progress Notes (Signed)
Pt C/O decreased FM for last 8 hours, denies uc's, bleeding or uc's.

## 2011-08-22 ENCOUNTER — Encounter (HOSPITAL_COMMUNITY): Payer: Self-pay

## 2011-08-22 ENCOUNTER — Encounter (HOSPITAL_COMMUNITY): Payer: Self-pay | Admitting: Anesthesiology

## 2011-08-22 ENCOUNTER — Inpatient Hospital Stay (HOSPITAL_COMMUNITY): Payer: Medicaid Other | Admitting: Anesthesiology

## 2011-08-22 ENCOUNTER — Encounter (HOSPITAL_COMMUNITY)
Admission: AD | Disposition: A | Discharge status: Home / Self Care | Payer: Self-pay | Source: Ambulatory Visit | Attending: Obstetrics & Gynecology

## 2011-08-22 ENCOUNTER — Other Ambulatory Visit: Payer: Self-pay | Admitting: Obstetrics & Gynecology

## 2011-08-22 DIAGNOSIS — O480 Post-term pregnancy: Secondary | ICD-10-CM | POA: Diagnosis present

## 2011-08-22 SURGERY — Surgical Case
Anesthesia: Regional | Site: Abdomen | Wound class: Clean Contaminated

## 2011-08-22 MED ORDER — MAGNESIUM HYDROXIDE 400 MG/5ML PO SUSP: 30.0000 mL | ORAL | Status: DC | PRN

## 2011-08-22 MED ORDER — MORPHINE SULFATE 0.5 MG/ML IJ SOLN
INTRAMUSCULAR | Status: AC
  Filled 2011-08-22: qty 10

## 2011-08-22 MED ORDER — PRENATAL MULTIVITAMIN CH
1.0000 | ORAL_TABLET | Freq: Every day | ORAL | Status: DC
  Administered 2011-08-23 – 2011-08-25 (×4): 1 via ORAL
  Filled 2011-08-22 (×3): qty 1

## 2011-08-22 MED ORDER — LIDOCAINE HCL 1.5 % IJ SOLN
INTRAMUSCULAR | Status: DC | PRN
  Administered 2011-08-22 (×2): 4 mL via EPIDURAL

## 2011-08-22 MED ORDER — SODIUM CHLORIDE 0.9 % IJ SOLN: 3.0000 mL | INTRAMUSCULAR | Status: DC | PRN

## 2011-08-22 MED ORDER — DIPHENHYDRAMINE HCL 25 MG PO CAPS: 25.0000 mg | ORAL_CAPSULE | ORAL | Status: DC | PRN

## 2011-08-22 MED ORDER — IBUPROFEN 600 MG PO TABS: 600.0000 mg | ORAL_TABLET | Freq: Four times a day (QID) | ORAL | Status: DC | PRN

## 2011-08-22 MED ORDER — EPHEDRINE 5 MG/ML INJ: 10.0000 mg | INTRAVENOUS | Status: DC | PRN

## 2011-08-22 MED ORDER — SODIUM BICARBONATE 8.4 % IV SOLN
INTRAVENOUS | Status: AC
  Filled 2011-08-22: qty 50

## 2011-08-22 MED ORDER — SODIUM CHLORIDE 0.9 % IV SOLN
2.0000 g | Freq: Four times a day (QID) | INTRAVENOUS | Status: DC
  Filled 2011-08-22: qty 2000

## 2011-08-22 MED ORDER — FENTANYL 2.5 MCG/ML BUPIVACAINE 1/10 % EPIDURAL INFUSION (WH - ANES)
INTRAMUSCULAR | Status: DC | PRN
  Administered 2011-08-22: 14 mL/h via EPIDURAL

## 2011-08-22 MED ORDER — OXYTOCIN 10 UNIT/ML IJ SOLN
20.0000 [IU] | INTRAVENOUS | Status: DC | PRN
  Administered 2011-08-22: 20 [IU] via INTRAVENOUS

## 2011-08-22 MED ORDER — FENTANYL CITRATE 0.05 MG/ML IJ SOLN
INTRAMUSCULAR | Status: AC
  Filled 2011-08-22: qty 2

## 2011-08-22 MED ORDER — NALBUPHINE SYRINGE 5 MG/0.5 ML
10.0000 mg | INJECTION | Freq: Once | INTRAMUSCULAR | Status: AC
  Administered 2011-08-22: 10 mg via INTRAVENOUS
  Filled 2011-08-22 (×2): qty 0.5

## 2011-08-22 MED ORDER — LACTATED RINGERS IV SOLN
INTRAVENOUS | Status: DC | PRN
  Administered 2011-08-22 (×3): via INTRAVENOUS

## 2011-08-22 MED ORDER — IBUPROFEN 600 MG PO TABS
600.0000 mg | ORAL_TABLET | Freq: Four times a day (QID) | ORAL | Status: DC
  Administered 2011-08-23 – 2011-08-25 (×11): 600 mg via ORAL
  Filled 2011-08-22 (×11): qty 1

## 2011-08-22 MED ORDER — SIMETHICONE 80 MG PO CHEW
80.0000 mg | CHEWABLE_TABLET | ORAL | Status: DC | PRN
  Administered 2011-08-23 – 2011-08-25 (×2): 80 mg via ORAL

## 2011-08-22 MED ORDER — LIDOCAINE-EPINEPHRINE (PF) 2 %-1:200000 IJ SOLN
INTRAMUSCULAR | Status: AC
  Filled 2011-08-22: qty 20

## 2011-08-22 MED ORDER — KETOROLAC TROMETHAMINE 30 MG/ML IJ SOLN: 30.0000 mg | Freq: Four times a day (QID) | INTRAMUSCULAR | Status: AC | PRN

## 2011-08-22 MED ORDER — GENTAMICIN SULFATE 40 MG/ML IJ SOLN
Freq: Three times a day (TID) | INTRAVENOUS | Status: DC
  Administered 2011-08-23 (×2): via INTRAVENOUS
  Filled 2011-08-22 (×4): qty 3.25

## 2011-08-22 MED ORDER — GENTAMICIN SULFATE 40 MG/ML IJ SOLN
140.0000 mg | Freq: Once | INTRAVENOUS | Status: AC
  Administered 2011-08-22: 140 mg via INTRAVENOUS
  Filled 2011-08-22: qty 3.5

## 2011-08-22 MED ORDER — TETANUS-DIPHTH-ACELL PERTUSSIS 5-2.5-18.5 LF-MCG/0.5 IM SUSP
0.5000 mL | Freq: Once | INTRAMUSCULAR | Status: AC
  Administered 2011-08-23: 0.5 mL via INTRAMUSCULAR
  Filled 2011-08-22: qty 0.5

## 2011-08-22 MED ORDER — ONDANSETRON HCL 4 MG/2ML IJ SOLN: 4.0000 mg | Freq: Three times a day (TID) | INTRAMUSCULAR | Status: DC | PRN

## 2011-08-22 MED ORDER — SENNOSIDES-DOCUSATE SODIUM 8.6-50 MG PO TABS
2.0000 | ORAL_TABLET | Freq: Every day | ORAL | Status: DC
  Administered 2011-08-23: 2 via ORAL

## 2011-08-22 MED ORDER — CLINDAMYCIN PHOSPHATE 600 MG/50ML IV SOLN
INTRAVENOUS | Status: DC | PRN
  Administered 2011-08-22: 900 mg via INTRAVENOUS

## 2011-08-22 MED ORDER — KETOROLAC TROMETHAMINE 60 MG/2ML IM SOLN
INTRAMUSCULAR | Status: AC
  Filled 2011-08-22: qty 2

## 2011-08-22 MED ORDER — SODIUM BICARBONATE 8.4 % IV SOLN
INTRAVENOUS | Status: DC | PRN
  Administered 2011-08-22: 5 mL via EPIDURAL

## 2011-08-22 MED ORDER — SCOPOLAMINE 1 MG/3DAYS TD PT72
MEDICATED_PATCH | TRANSDERMAL | Status: AC
  Filled 2011-08-22: qty 1

## 2011-08-22 MED ORDER — MEPERIDINE HCL 25 MG/ML IJ SOLN: 6.2500 mg | INTRAMUSCULAR | Status: DC | PRN

## 2011-08-22 MED ORDER — DIPHENHYDRAMINE HCL 50 MG/ML IJ SOLN: 25.0000 mg | INTRAMUSCULAR | Status: DC | PRN

## 2011-08-22 MED ORDER — SODIUM CHLORIDE 0.9 % IV SOLN
2.0000 g | Freq: Four times a day (QID) | INTRAVENOUS | Status: DC
  Administered 2011-08-22 – 2011-08-25 (×10): 2 g via INTRAVENOUS
  Filled 2011-08-22 (×14): qty 2000

## 2011-08-22 MED ORDER — GENTAMICIN SULFATE 40 MG/ML IJ SOLN
130.0000 mg | Freq: Three times a day (TID) | INTRAVENOUS | Status: DC
  Filled 2011-08-22: qty 3.25

## 2011-08-22 MED ORDER — ONDANSETRON HCL 4 MG/2ML IJ SOLN: 4.0000 mg | INTRAMUSCULAR | Status: DC | PRN

## 2011-08-22 MED ORDER — MEDROXYPROGESTERONE ACETATE 150 MG/ML IM SUSP: 150.0000 mg | INTRAMUSCULAR | Status: DC | PRN

## 2011-08-22 MED ORDER — PHENYLEPHRINE HCL 10 MG/ML IJ SOLN
INTRAMUSCULAR | Status: DC | PRN
  Administered 2011-08-22: 40 ug via INTRAVENOUS
  Administered 2011-08-22 (×2): 120 ug via INTRAVENOUS
  Administered 2011-08-22 (×4): 80 ug via INTRAVENOUS

## 2011-08-22 MED ORDER — PHENYLEPHRINE 40 MCG/ML (10ML) SYRINGE FOR IV PUSH (FOR BLOOD PRESSURE SUPPORT)
PREFILLED_SYRINGE | INTRAVENOUS | Status: AC
  Filled 2011-08-22: qty 5

## 2011-08-22 MED ORDER — LACTATED RINGERS IV SOLN
INTRAVENOUS | Status: DC
  Administered 2011-08-22: 11:00:00 via INTRAUTERINE

## 2011-08-22 MED ORDER — FERROUS SULFATE 325 (65 FE) MG PO TABS
325.0000 mg | ORAL_TABLET | Freq: Two times a day (BID) | ORAL | Status: DC
  Administered 2011-08-23 – 2011-08-25 (×5): 325 mg via ORAL
  Filled 2011-08-22 (×5): qty 1

## 2011-08-22 MED ORDER — OXYTOCIN 10 UNIT/ML IJ SOLN
INTRAMUSCULAR | Status: AC
  Filled 2011-08-22: qty 2

## 2011-08-22 MED ORDER — OXYCODONE-ACETAMINOPHEN 5-325 MG PO TABS
1.0000 | ORAL_TABLET | ORAL | Status: DC | PRN
  Administered 2011-08-23: 1 via ORAL
  Administered 2011-08-24 – 2011-08-25 (×7): 2 via ORAL
  Filled 2011-08-22 (×4): qty 2
  Filled 2011-08-22: qty 1
  Filled 2011-08-22 (×2): qty 2
  Filled 2011-08-22 (×2): qty 1

## 2011-08-22 MED ORDER — MORPHINE SULFATE (PF) 0.5 MG/ML IJ SOLN
INTRAMUSCULAR | Status: DC | PRN
  Administered 2011-08-22: 1 mg via INTRAVENOUS

## 2011-08-22 MED ORDER — NALOXONE HCL 0.4 MG/ML IJ SOLN: 0.4000 mg | INTRAMUSCULAR | Status: DC | PRN

## 2011-08-22 MED ORDER — SODIUM CHLORIDE 0.9 % IV SOLN: 1.0000 ug/kg/h | INTRAVENOUS | Status: DC | PRN

## 2011-08-22 MED ORDER — PHENYLEPHRINE 40 MCG/ML (10ML) SYRINGE FOR IV PUSH (FOR BLOOD PRESSURE SUPPORT)
80.0000 ug | PREFILLED_SYRINGE | INTRAVENOUS | Status: DC | PRN
  Administered 2011-08-22: 80 ug via INTRAVENOUS
  Filled 2011-08-22: qty 5

## 2011-08-22 MED ORDER — ONDANSETRON HCL 4 MG/2ML IJ SOLN
INTRAMUSCULAR | Status: DC | PRN
  Administered 2011-08-22: 4 mg via INTRAVENOUS

## 2011-08-22 MED ORDER — SODIUM CHLORIDE 0.9 % IR SOLN
Status: DC | PRN
  Administered 2011-08-22: 2000 mL

## 2011-08-22 MED ORDER — DIPHENHYDRAMINE HCL 50 MG/ML IJ SOLN: 12.5000 mg | INTRAMUSCULAR | Status: DC | PRN

## 2011-08-22 MED ORDER — MORPHINE SULFATE (PF) 0.5 MG/ML IJ SOLN
INTRAMUSCULAR | Status: DC | PRN
  Administered 2011-08-22: 4 mg via EPIDURAL

## 2011-08-22 MED ORDER — PHENYLEPHRINE 40 MCG/ML (10ML) SYRINGE FOR IV PUSH (FOR BLOOD PRESSURE SUPPORT)
PREFILLED_SYRINGE | INTRAVENOUS | Status: AC
  Filled 2011-08-22: qty 10

## 2011-08-22 MED ORDER — AMPICILLIN SODIUM 1 G IJ SOLR
1.0000 g | Freq: Four times a day (QID) | INTRAMUSCULAR | Status: DC
  Administered 2011-08-22: 1 g via INTRAVENOUS
  Filled 2011-08-22 (×3): qty 1000

## 2011-08-22 MED ORDER — SCOPOLAMINE 1 MG/3DAYS TD PT72
1.0000 | MEDICATED_PATCH | Freq: Once | TRANSDERMAL | Status: AC
  Administered 2011-08-22: 1.5 mg via TRANSDERMAL

## 2011-08-22 MED ORDER — METOCLOPRAMIDE HCL 5 MG/ML IJ SOLN: 10.0000 mg | Freq: Three times a day (TID) | INTRAMUSCULAR | Status: DC | PRN

## 2011-08-22 MED ORDER — CLINDAMYCIN PHOSPHATE 900 MG/50ML IV SOLN: 900.0000 mg | Freq: Three times a day (TID) | INTRAVENOUS | Status: DC

## 2011-08-22 MED ORDER — OXYTOCIN 20 UNITS IN LACTATED RINGERS INFUSION - SIMPLE: 125.0000 mL/h | INTRAVENOUS | Status: AC

## 2011-08-22 MED ORDER — FENTANYL 2.5 MCG/ML BUPIVACAINE 1/10 % EPIDURAL INFUSION (WH - ANES)
14.0000 mL/h | INTRAMUSCULAR | Status: DC
  Administered 2011-08-22 (×3): 14 mL/h via EPIDURAL
  Filled 2011-08-22 (×4): qty 60

## 2011-08-22 MED ORDER — EPHEDRINE 5 MG/ML INJ
10.0000 mg | INTRAVENOUS | Status: DC | PRN
  Administered 2011-08-22: 20 mg via INTRAVENOUS
  Filled 2011-08-22: qty 4

## 2011-08-22 MED ORDER — ONDANSETRON HCL 4 MG/2ML IJ SOLN
INTRAMUSCULAR | Status: AC
  Filled 2011-08-22: qty 2

## 2011-08-22 MED ORDER — NALBUPHINE SYRINGE 5 MG/0.5 ML
10.0000 mg | INJECTION | Freq: Once | INTRAMUSCULAR | Status: AC
  Administered 2011-08-22: 10 mg via INTRAMUSCULAR
  Filled 2011-08-22 (×2): qty 0.5

## 2011-08-22 MED ORDER — KETOROLAC TROMETHAMINE 60 MG/2ML IM SOLN
60.0000 mg | Freq: Once | INTRAMUSCULAR | Status: AC | PRN
  Administered 2011-08-22: 60 mg via INTRAMUSCULAR

## 2011-08-22 MED ORDER — LANOLIN HYDROUS EX OINT: 1.0000 "application " | TOPICAL_OINTMENT | CUTANEOUS | Status: DC | PRN

## 2011-08-22 MED ORDER — DIPHENHYDRAMINE HCL 25 MG PO CAPS: 25.0000 mg | ORAL_CAPSULE | Freq: Four times a day (QID) | ORAL | Status: DC | PRN

## 2011-08-22 MED ORDER — LACTATED RINGERS IV SOLN
500.0000 mL | Freq: Once | INTRAVENOUS | Status: AC
  Administered 2011-08-22: 500 mL via INTRAVENOUS

## 2011-08-22 MED ORDER — CLINDAMYCIN PHOSPHATE 900 MG/50ML IV SOLN
900.0000 mg | Freq: Three times a day (TID) | INTRAVENOUS | Status: DC
  Filled 2011-08-22 (×2): qty 50

## 2011-08-22 MED ORDER — FENTANYL CITRATE 0.05 MG/ML IJ SOLN: 25.0000 ug | INTRAMUSCULAR | Status: DC | PRN

## 2011-08-22 MED ORDER — LACTATED RINGERS IV SOLN
INTRAVENOUS | Status: DC
  Administered 2011-08-23 – 2011-08-24 (×4): via INTRAVENOUS

## 2011-08-22 MED ORDER — ONDANSETRON HCL 4 MG PO TABS: 4.0000 mg | ORAL_TABLET | ORAL | Status: DC | PRN

## 2011-08-22 MED ORDER — WITCH HAZEL-GLYCERIN EX PADS: 1.0000 "application " | MEDICATED_PAD | CUTANEOUS | Status: DC | PRN

## 2011-08-22 MED ORDER — DIBUCAINE 1 % RE OINT: 1.0000 "application " | TOPICAL_OINTMENT | RECTAL | Status: DC | PRN

## 2011-08-22 MED ORDER — NALBUPHINE HCL 10 MG/ML IJ SOLN
5.0000 mg | INTRAMUSCULAR | Status: DC | PRN
  Administered 2011-08-23: 10 mg via INTRAVENOUS
  Filled 2011-08-22: qty 1

## 2011-08-22 MED ORDER — HYDROXYZINE HCL 50 MG/ML IM SOLN
50.0000 mg | Freq: Once | INTRAMUSCULAR | Status: AC
  Administered 2011-08-22: 50 mg via INTRAMUSCULAR
  Filled 2011-08-22: qty 1

## 2011-08-22 MED ORDER — ZOLPIDEM TARTRATE 5 MG PO TABS: 5.0000 mg | ORAL_TABLET | Freq: Every evening | ORAL | Status: DC | PRN

## 2011-08-22 MED ORDER — OXYTOCIN 10 UNIT/ML IJ SOLN
INTRAMUSCULAR | Status: AC
  Filled 2011-08-22: qty 4

## 2011-08-22 MED ORDER — MEASLES, MUMPS & RUBELLA VAC ~~LOC~~ INJ: 0.5000 mL | INJECTION | Freq: Once | SUBCUTANEOUS | Status: DC

## 2011-08-22 MED ORDER — CLINDAMYCIN PHOSPHATE 600 MG/50ML IV SOLN: 600.0000 mg | Freq: Four times a day (QID) | INTRAVENOUS | Status: DC

## 2011-08-22 MED ORDER — FENTANYL CITRATE 0.05 MG/ML IJ SOLN
INTRAMUSCULAR | Status: DC | PRN
  Administered 2011-08-22: 100 ug via INTRAVENOUS

## 2011-08-22 MED ORDER — NALBUPHINE HCL 10 MG/ML IJ SOLN: 5.0000 mg | INTRAMUSCULAR | Status: DC | PRN

## 2011-08-22 MED ORDER — PHENYLEPHRINE 40 MCG/ML (10ML) SYRINGE FOR IV PUSH (FOR BLOOD PRESSURE SUPPORT): 80.0000 ug | PREFILLED_SYRINGE | INTRAVENOUS | Status: DC | PRN

## 2011-08-22 MED ORDER — INFLUENZA VIRUS VACC SPLIT PF IM SUSP: 0.5000 mL | INTRAMUSCULAR | Status: DC | PRN

## 2011-08-22 SURGICAL SUPPLY — 46 items
APL SKNCLS STERI-STRIP NONHPOA (GAUZE/BANDAGES/DRESSINGS) ×1
BENZOIN TINCTURE PRP APPL 2/3 (GAUZE/BANDAGES/DRESSINGS) ×2 IMPLANT
CANISTER WOUND CARE 500ML ATS (WOUND CARE) IMPLANT
CHLORAPREP W/TINT 26ML (MISCELLANEOUS) ×2 IMPLANT
CLOSURE STERI STRIP 1/2 X4 (GAUZE/BANDAGES/DRESSINGS) ×1 IMPLANT
CLOTH BEACON ORANGE TIMEOUT ST (SAFETY) ×2 IMPLANT
CONTAINER PREFILL 10% NBF 15ML (MISCELLANEOUS) IMPLANT
DRESSING TELFA 8X3 (GAUZE/BANDAGES/DRESSINGS) ×3 IMPLANT
DRSG COVADERM 4X10 (GAUZE/BANDAGES/DRESSINGS) ×1 IMPLANT
DRSG PAD ABDOMINAL 8X10 ST (GAUZE/BANDAGES/DRESSINGS) ×1 IMPLANT
DRSG VAC ATS LRG SENSATRAC (GAUZE/BANDAGES/DRESSINGS) IMPLANT
DRSG VAC ATS MED SENSATRAC (GAUZE/BANDAGES/DRESSINGS) IMPLANT
DRSG VAC ATS SM SENSATRAC (GAUZE/BANDAGES/DRESSINGS) IMPLANT
ELECT REM PT RETURN 9FT ADLT (ELECTROSURGICAL) ×2
ELECTRODE REM PT RTRN 9FT ADLT (ELECTROSURGICAL) ×1 IMPLANT
EXTRACTOR VACUUM M CUP 4 TUBE (SUCTIONS) IMPLANT
GAUZE SPONGE 4X4 12PLY STRL LF (GAUZE/BANDAGES/DRESSINGS) ×2 IMPLANT
GLOVE BIO SURGEON STRL SZ 6.5 (GLOVE) ×4 IMPLANT
GOWN PREVENTION PLUS LG XLONG (DISPOSABLE) ×6 IMPLANT
KIT ABG SYR 3ML LUER SLIP (SYRINGE) ×1 IMPLANT
NDL HYPO 25X5/8 SAFETYGLIDE (NEEDLE) ×1 IMPLANT
NEEDLE HYPO 25X5/8 SAFETYGLIDE (NEEDLE) ×2 IMPLANT
NS IRRIG 1000ML POUR BTL (IV SOLUTION) ×2 IMPLANT
PACK C SECTION WH (CUSTOM PROCEDURE TRAY) ×2 IMPLANT
PAD ABD 7.5X8 STRL (GAUZE/BANDAGES/DRESSINGS) ×2 IMPLANT
RTRCTR C-SECT PINK 25CM LRG (MISCELLANEOUS) IMPLANT
RTRCTR C-SECT PINK 34CM XLRG (MISCELLANEOUS) IMPLANT
SLEEVE SCD COMPRESS KNEE MED (MISCELLANEOUS) IMPLANT
STAPLER VISISTAT 35W (STAPLE) IMPLANT
STRIP CLOSURE SKIN 1/2X4 (GAUZE/BANDAGES/DRESSINGS) ×2 IMPLANT
SUT MNCRL 0 VIOLET CTX 36 (SUTURE) ×2 IMPLANT
SUT MNCRL AB 3-0 PS2 27 (SUTURE) ×1 IMPLANT
SUT MONOCRYL 0 CTX 36 (SUTURE) ×2
SUT PDS AB 0 CTX 36 PDP370T (SUTURE) ×1 IMPLANT
SUT PLAIN 0 NONE (SUTURE) IMPLANT
SUT VIC AB 0 CT1 27 (SUTURE) ×4
SUT VIC AB 0 CT1 27XBRD ANBCTR (SUTURE) IMPLANT
SUT VIC AB 2-0 CT1 (SUTURE) IMPLANT
SUT VIC AB 2-0 CT1 27 (SUTURE) ×2
SUT VIC AB 2-0 CT1 TAPERPNT 27 (SUTURE) ×1 IMPLANT
SUT VIC AB 2-0 SH 27 (SUTURE)
SUT VIC AB 2-0 SH 27XBRD (SUTURE) IMPLANT
TAPE CLOTH SURG 4X10 WHT LF (GAUZE/BANDAGES/DRESSINGS) ×1 IMPLANT
TOWEL OR 17X24 6PK STRL BLUE (TOWEL DISPOSABLE) ×4 IMPLANT
TRAY FOLEY CATH 14FR (SET/KITS/TRAYS/PACK) ×1 IMPLANT
WATER STERILE IRR 1000ML POUR (IV SOLUTION) ×2 IMPLANT

## 2011-08-22 NOTE — Op Note (Signed)
Cesarean Section Procedure Note   Kayla Strickland   08/21/2011 - 08/22/2011  Indications: Protracted latent labor, chorioamnionitis, Category 2 FHT   Pre-operative Diagnosis: chorioamnioitis/nonreassuring fetal heart tracing.   Post-operative Diagnosis: Same   Surgeon: Antionette Char A  Assistants: none  Anesthesia: epidural  Procedure Details:  The patient was seen in the Holding Room. The risks, benefits, complications, treatment options, and expected outcomes were discussed with the patient. The patient concurred with the proposed plan, giving informed consent. The patient was identified as Kayla Strickland and the procedure verified as C-Section Delivery. A Time Out was held and the above information confirmed.  After induction of anesthesia, the patient was draped and prepped in the usual sterile manner. A transverse incision was made and carried down through the subcutaneous tissue to the fascia. The fascial incision was made and extended transversely. The fascia was separated from the underlying rectus tissue superiorly and inferiorly. The peritoneum was identified and entered. The peritoneal incision was extended longitudinally. The utero-vesical peritoneal reflection was incised transversely. A low transverse uterine incision was made. Delivered from cephalic presentation was a  living newborn female infant with Apgar scores of 8 at one minute and 9 at five minutes. A cord ph was sent. The umbilical cord was clamped and cut cord. A sample was obtained for evaluation. The placenta was removed Intact and appeared normal.  The uterine incision was closed with running locked sutures of 1-0 Monocryl. A second imbricating layer of the same suture was placed.  Hemostasis was observed. The paracolic gutters were irrigated. The fascia was then reapproximated with running sutures of 1-0Vicryl. The subcuticular closure was performed using 3-0 Monocryl.  Instrument, sponge, and needle  counts were correct prior the abdominal closure and were correct at the conclusion of the case.    Findings:  See above, OP position, meconium staining   Estimated Blood Loss: 800 ml   Total IV Fluids:   Urine Output: 400CC OF clear urine  Specimens: Placenta  Complications: no complications  Disposition: PACU - hemodynamically stable.  Maternal Condition: stable   Baby condition / location:  nursery-stable    Signed: Surgeon(s): Roseanna Rainbow, MD

## 2011-08-22 NOTE — Progress Notes (Signed)
Dr Tamela Oddi notified of FHR tracing with minimal variability, late decels, interventions provided, and mat temp. Requested to look at [facility] tracing

## 2011-08-22 NOTE — Progress Notes (Addendum)
Dr Liam Rogers notified of pt status, FHR tracing with minimal variability, repetitive variables, and baseline change, UCs and MVUs, SVE, and mat temp and interventions provided. Orders for anbx.

## 2011-08-22 NOTE — Progress Notes (Signed)
Dr Tamela Oddi at bedside discussing risks and benefits of C/S. Pt verbalizes understanding.

## 2011-08-22 NOTE — Anesthesia Procedure Notes (Signed)
Epidural Patient location during procedure: OB Start time: 08/22/2011 5:10 AM  Staffing Anesthesiologist: Malike Foglio A. Performed by: anesthesiologist   Preanesthetic Checklist Completed: patient identified, site marked, surgical consent, pre-op evaluation, timeout performed, IV checked, risks and benefits discussed and monitors and equipment checked  Epidural Patient position: sitting Prep: site prepped and draped and DuraPrep Patient monitoring: continuous pulse ox and blood pressure Approach: midline Injection technique: LOR air  Needle:  Needle type: Tuohy  Needle gauge: 17 G Needle length: 9 cm Needle insertion depth: 5 cm cm Catheter type: closed end flexible Catheter size: 19 Gauge Catheter at skin depth: 10 cm Test dose: negative and 1.5% lidocaine  Assessment Events: blood not aspirated, injection not painful, no injection resistance, negative IV test and no paresthesia  Additional Notes Patient identified. Risks and benefits discussed including failed block, incomplete  Pain control, post dural puncture headache, nerve damage, paralysis, blood pressure Changes, nausea, vomiting, reactions to medications-both toxic and allergic and post Partum back pain. All questions were answered. Patient expressed understanding and wished to proceed. Sterile technique was used throughout procedure. Epidural site was Dressed with sterile barrier dressing. No paresthesias, signs of intravascular injection Or signs of intrathecal spread were encountered.  Patient was more comfortable after the epidural was dosed. Please see RN's note for documentation of vital signs and FHR which are stable.

## 2011-08-22 NOTE — Progress Notes (Signed)
Delivery of live viable female by Dr Tamela Oddi. NICU at bedside for delivery

## 2011-08-22 NOTE — OR Nursing (Signed)
Uterus massaged by S. Antwain Caliendo Charity fundraiser. Two tubes of cord blood to lab. Foley catheter in place upon arrival to OR. Urine color-slightly concentrated.

## 2011-08-22 NOTE — Anesthesia Preprocedure Evaluation (Addendum)
Anesthesia Evaluation  Patient identified by MRN, date of birth, ID band Patient awake    Reviewed: Allergy & Precautions, H&P , Patient's Chart, lab work & pertinent test results  Airway Mallampati: III TM Distance: >3 FB Neck ROM: full    Dental No notable dental hx. (+) Teeth Intact   Pulmonary neg pulmonary ROS,  clear to auscultation  Pulmonary exam normal       Cardiovascular neg cardio ROS regular Normal    Neuro/Psych Negative Neurological ROS  Negative Psych ROS   GI/Hepatic negative GI ROS, Neg liver ROS,   Endo/Other  Negative Endocrine ROS  Renal/GU negative Renal ROS  Genitourinary negative   Musculoskeletal   Abdominal Normal abdominal exam  (+)   Peds  Hematology negative hematology ROS (+)   Anesthesia Other Findings Levoscoliosis Lumbar Spine  Reproductive/Obstetrics (+) Pregnancy                          Anesthesia Physical Anesthesia Plan  ASA: II  Anesthesia Plan: Epidural   Post-op Pain Management:    Induction:   Airway Management Planned:   Additional Equipment:   Intra-op Plan:   Post-operative Plan:   Informed Consent: I have reviewed the patients History and Physical, chart, labs and discussed the procedure including the risks, benefits and alternatives for the proposed anesthesia with the patient or authorized representative who has indicated his/her understanding and acceptance.     Plan Discussed with: Anesthesiologist and Surgeon  Anesthesia Plan Comments:         Anesthesia Quick Evaluation

## 2011-08-22 NOTE — Progress Notes (Signed)
Kayla Strickland is a 36 y.o. G8P0070 at [redacted]w[redacted]d by LMP admitted for induction of labor due to decreased FM.  Subjective: Comfortable  Objective: BP 111/72  Pulse 80  Temp(Src) 98 F (36.7 C) (Oral)  Resp 18  Ht 5\' 4"  (1.626 m)  Wt 77.111 kg (170 lb)  BMI 29.18 kg/m2  LMP 11/10/2010      FHT:  FHR: 140 bpm, variability: moderate,  accelerations:  Present,  decelerations:  Absent UC:   irregular, every 5 minutes SVE:   Dilation: 3.5 Effacement (%): 60 Station: -2 Exam by:: Tressia Danas RN IUPC placed  Labs: Lab Results  Component Value Date   WBC 6.3 08/21/2011   HGB 12.8 08/21/2011   HCT 38.0 08/21/2011   MCV 97.7 08/21/2011   PLT 198 08/21/2011    Assessment / Plan: Early labor  Labor: See above Preeclampsia:  n/a Fetal Wellbeing:  Category I, amnioinfusion Pain Control:  Epidural I/D:  n/a Anticipated MOD:  NSVD  JACKSON-MOORE,Draeden Kellman A 08/22/2011, 10:33 AM

## 2011-08-22 NOTE — Progress Notes (Signed)
Husband and patient very concerned for labor/delivery process. Discussed POC with patient and explained the reason of urgency for the IOL. Discovered patient and husbands families have had poor history in the past with deliveries that had resulted in loss of the infants. Reassured patient and family of the POC and educated them on interventions, FHR, etc. Pt and family were satisfied and seemed appreciative.

## 2011-08-22 NOTE — Anesthesia Postprocedure Evaluation (Signed)
Anesthesia Post Note  Patient: Kayla Strickland  Procedure(s) Performed:  CESAREAN SECTION - Primary cesarean section with delivery of baby girl at 65. Apgars 8/9.  Anesthesia type: Epidural  Patient location: PACU  Post pain: Pain level controlled  Post assessment: Post-op Vital signs reviewed  Last Vitals:  Filed Vitals:   08/22/11 1930  BP: 120/72  Pulse: 69  Temp:   Resp: 14    Post vital signs: stable  Level of consciousness: awake  Complications: No apparent anesthesia complications

## 2011-08-22 NOTE — Progress Notes (Signed)
ANTIBIOTIC CONSULT NOTE - INITIAL  Pharmacy Consult for Gentamicin Indication: Chorioamnionitis  No Known Allergies  Patient Measurements: Height: 5\' 4"  (162.6 cm) Weight: 170 lb (77.111 kg) IBW/kg (Calculated) : 54.7  Adjusted Body Weight: 61.5  Vital Signs: Temp: 101 F (38.3 C) (02/09 1258) Temp src: Oral (02/09 1258) BP: 125/73 mmHg (02/09 1332) Pulse Rate: 78  (02/09 1332)  Labs:  Basename 08/21/11 1036  WBC 6.3  HGB 12.8  PLT 198  LABCREA --  CREATININE --  CRCLEARANCE --   No results found for this basename: GENTTROUGH:2,GENTPEAK:2,GENTRANDOM:2, in the last 72 hours   Microbiology: No results found for this or any previous visit (from the past 720 hour(s)).  Medications:  Ampicilllin 1g IV q6hr  Assessment: 36 yo G8P0 at 40wk5d admitted for IOL, now with maternal temperature and FHR tracing changes. Started on Amp/Gent to cover chorioamnionitis. Estimated Ke = 0.296, Vd = 0.35L/kg  Goal of Therapy:  Gentamicin peak 6-8 mg/L and Trough < 1 mg/L  Plan:  Gentamicin 140 mg IV x 1  Gentamicin 130 mg IV every 8 hrs  Check Scr with next labs if gentamicin continued postpartum Will check gentamicin levels if continued > 72hr or clinically indicated.  Kayla Strickland 08/22/2011,1:40 PM

## 2011-08-22 NOTE — Progress Notes (Signed)
Kayla Strickland is a 36 y.o. G8P0070 at [redacted]w[redacted]d by LMP admitted for induction of labor due to [facility].  Subjective: Comfortable  Objective: BP 125/58  Pulse 75  Temp(Src) 102.3 F (39.1 C) (Axillary)  Resp 18  Ht 5\' 4"  (1.626 m)  Wt 77.111 kg (170 lb)  BMI 29.18 kg/m2  LMP 11/10/2010      FHT:  FHR: 180 bpm, variability: moderate,  accelerations:  Abscent,  decelerations:  Present late onset UC:   irregular, every 5 minutes SVE:   Dilation: 4 Effacement (%): 60 Station: -2 Exam by:: L. Mcdaniel RN Meconium staining  Labs: Lab Results  Component Value Date   WBC 6.3 08/21/2011   HGB 12.8 08/21/2011   HCT 38.0 08/21/2011   MCV 97.7 08/21/2011   PLT 198 08/21/2011    Assessment / Plan: Protracted latent phase  Labor: see above Preeclampsia:  n/a Fetal Wellbeing:  Category II Pain Control:  Epidural I/D:  Chorioamnionitis Anticipated MOD:  C/D  JACKSON-MOORE,Nelli Swalley A 08/22/2011, 5:34 PM

## 2011-08-22 NOTE — Plan of Care (Signed)
Pt's husband concerned that induction process is taking to long. He is worried about his child and his wife. He asks many multiple repeating questions. Spent a long while talking with pt and her husband about how the induction process works, how often cervical ripening can take over 24 hours. How we are starting with no labor and trying to start labor. Husband seems to want a c/s but not sure what he wants. Discussed with him and his wife how they need to have this discussion with their primary care ob doctor in the morning and that at this time there are no worries or concerns with fetal heart rate or contractions.

## 2011-08-22 NOTE — Transfer of Care (Signed)
Immediate Anesthesia Transfer of Care Note  Patient: Kayla Strickland  Procedure(s) Performed:  CESAREAN SECTION - Primary cesarean section with delivery of baby girl at 38. Apgars 8/9.  Patient Location: PACU  Anesthesia Type: Epidural  Level of Consciousness: awake, alert  and oriented  Airway & Oxygen Therapy: Patient Spontanous Breathing  Post-op Assessment: Report given to PACU RN and Post -op Vital signs reviewed and stable  Post vital signs: stable  Complications: No apparent anesthesia complications

## 2011-08-23 DIAGNOSIS — O411290 Chorioamnionitis, unspecified trimester, not applicable or unspecified: Secondary | ICD-10-CM | POA: Diagnosis not present

## 2011-08-23 LAB — CREATININE, SERUM
GFR calc Af Amer: >90 mL/min (ref >90)
GFR calc non Af Amer: 79 mL/min — ABNORMAL LOW (ref >90)

## 2011-08-23 LAB — HEMOGLOBIN: Hemoglobin: 9.5 g/dL — ABNORMAL LOW (ref 12.0–15.0)

## 2011-08-23 MED ORDER — CLINDAMYCIN PHOSPHATE 900 MG/50ML IV SOLN
900.0000 mg | Freq: Three times a day (TID) | INTRAVENOUS | Status: DC
  Administered 2011-08-23 – 2011-08-25 (×5): 900 mg via INTRAVENOUS
  Filled 2011-08-23 (×8): qty 50

## 2011-08-23 MED ORDER — GENTAMICIN SULFATE 40 MG/ML IJ SOLN
150.0000 mg | Freq: Two times a day (BID) | INTRAVENOUS | Status: DC
  Administered 2011-08-23 – 2011-08-25 (×3): 150 mg via INTRAVENOUS
  Filled 2011-08-23 (×4): qty 3.75

## 2011-08-23 NOTE — Progress Notes (Signed)
ANTIBIOTIC CONSULT NOTE - FOLLOW UP  36 yo female S/P c/s started on antibiotics for chorioamnionitis. Antibiotics continued postpartum and patient continues to spike fevers. No baseline labs for renal function. Ordered Scr today to check renal function. Estimated Scr = 0.7 but actual Scr = 0.94. Will change gentamicin dose to 150mg  Q12hr to keep the trough below 1 mcg/ml. Will check levels at steady state if necessary.  Michelene Heady [facility] 08/23/2011 2:47 PM

## 2011-08-23 NOTE — Addendum Note (Signed)
Addendum  created 08/23/11 1102 by Lincoln Brigham, CRNA   Modules edited:Charges VN, Notes Section

## 2011-08-23 NOTE — Anesthesia Postprocedure Evaluation (Signed)
  Anesthesia Post-op Note  Patient: Kayla Strickland  Procedure(s) Performed:  CESAREAN SECTION - Primary cesarean section with delivery of baby girl at 28. Apgars 8/9.  Patient Location: Mother/Baby  Anesthesia Type: Epidural  Level of Consciousness: awake, alert  and oriented  Airway and Oxygen Therapy: Patient Spontanous Breathing  Post-op Pain: mild  Post-op Assessment: Patient's Cardiovascular Status Stable, Respiratory Function Stable, Patent Airway, No signs of Nausea or vomiting and Pain level controlled  Post-op Vital Signs: stable  Complications: No apparent anesthesia complications

## 2011-08-23 NOTE — Progress Notes (Signed)
Patient ID: Kayla Strickland, female   DOB: 05-26-1976, 36 y.o.   MRN: [id number] Subjective: POD# 1 s/p Cesarean Delivery.  Indications: failure to progress  RH status/Rubella reviewed. Feeding: breast Patient reports tolerating PO.  Denies HA/SOB/C/P/N/V/dizziness.  Reports flatus or BM. Breast symptoms: no.  She reports vaginal bleeding as normal, without clots.  She is ambulating, urinating without difficulty.     Objective: Vital signs in last 24 hours: BP 105/60  Pulse 89  Temp(Src) 98.8 F (37.1 C) (Oral)  Resp 20  Ht 5\' 4"  (1.626 m)  Wt 77.111 kg (170 lb)  BMI 29.18 kg/m2  SpO2 98%  LMP 11/10/2010  Breastfeeding? Unknown       Physical Exam:  General: alert CV: Regular rate and rhythm Resp: clear Abdomen: soft, nontender, normal bowel sounds Lochia: minimal Uterine Fundus: firm, below umbilicus, nontender Incision: dressing C/D Ext: extremities normal, atraumatic, no cyanosis or edema    Basename 08/23/11 0506 08/21/11 1036  HGB 9.5* 12.8  HCT -- 38.0      Assessment/Plan: 36 y.o.  status post Cesarean section. POD# 1.   Doing well, stable.   Continues to spike fevers in setting of recent chorioamnionitis Continue antibiotics/antipyretics            Advance diet as tolerated Start po pain meds D/C foley  HLIV  Ambulate IS Routine post-op care  JACKSON-MOORE,Lyon Dumont A 08/23/2011, 11:32 AM

## 2011-08-24 ENCOUNTER — Inpatient Hospital Stay (HOSPITAL_COMMUNITY): Payer: Medicaid Other

## 2011-08-24 DIAGNOSIS — M79609 Pain in unspecified limb: Secondary | ICD-10-CM

## 2011-08-24 NOTE — Progress Notes (Signed)
Left lower extremity venous duplex completed.  Preliminary report is negative for DVT, SVT, or a Baker's cyst in the left leg.  Negative for DVT in the right common femoral vein.  Technically difficult study due to the patient's guarding secondary to her pain.

## 2011-08-24 NOTE — Progress Notes (Signed)
Mulitple phone calls made to Dr. Clearance Coots reguarding pt. IV therapy, pt complaint of leg pain, correcting doppler orders, correcting u/s orders. Anesthesia with patient for extended time due to multiple IV attempts. Upon shift at 7 am, my pt. Had no IV access due to infiltration, therefore antibiotic therapy was delayed.  Pt. Was breastfeeding and was requesting a shower, further delay in antibiotic therapy.  MD made aware and oncoming shift nurse aware.  Per patients request, director at bedside for family to share experiences, further delaying antibiotic therapy.  Oncoming shift nurse notified.

## 2011-08-24 NOTE — Progress Notes (Signed)
Post Partum Day 2 Subjective: no complaints  Objective: Blood pressure 97/57, pulse 89, temperature 99.9 F (37.7 C), temperature source Oral, resp. rate 18, height 5\' 4"  (1.626 m), weight 170 lb (77.111 kg), last menstrual period 11/10/2010, SpO2 96.00%, unknown if currently breastfeeding.  Physical Exam:  General: alert and no distress Lochia: appropriate Uterine Fundus: firm, tender. Incision: healing well DVT Evaluation: No evidence of DVT seen on physical exam.   Basename 08/23/11 0506 08/21/11 1036  HGB 9.5* 12.8  HCT -- 38.0    Assessment/Plan: Endometritis.  S/P Primary LTCS for FTP.  Stable on triples.  Continue present care until uterus nontender.   LOS: 3 days   HARPER,CHARLES A 08/24/2011, 8:38 AM

## 2011-08-24 NOTE — Progress Notes (Signed)
UR chart review completed.  

## 2011-08-24 NOTE — Progress Notes (Signed)
Patient ID: Kayla Strickland, female   DOB: [birth year], 36 y.o.   MRN: [id number] Patient now c/o pain in left lower extremity that radiates into pelvis when walking.   PE:           LLE:  Pulses normal.  Warm.  No cords.  + Homan's.  A/P:  Painful LLE.  R/O DVT.  Dopplers of pelvis and LLE ordered.

## 2011-08-25 ENCOUNTER — Encounter (HOSPITAL_COMMUNITY): Payer: Self-pay | Admitting: Obstetrics & Gynecology

## 2011-08-25 ENCOUNTER — Inpatient Hospital Stay (HOSPITAL_COMMUNITY): Admission: RE | Admit: 2011-08-25 | Payer: Self-pay | Source: Ambulatory Visit

## 2011-08-25 MED ORDER — AMOXICILLIN-POT CLAVULANATE 500-125 MG PO TABS: 1.0000 | ORAL_TABLET | Freq: Three times a day (TID) | ORAL | Status: DC

## 2011-08-25 MED ORDER — BACITRACIN ZINC 500 UNIT/GM EX OINT
TOPICAL_OINTMENT | Freq: Two times a day (BID) | CUTANEOUS | Status: DC
  Administered 2011-08-25: 12:00:00 via TOPICAL
  Filled 2011-08-25: qty 15

## 2011-08-25 MED ORDER — OXYCODONE-ACETAMINOPHEN 5-325 MG PO TABS: 1.0000 | ORAL_TABLET | ORAL | Status: DC | PRN

## 2011-08-25 MED ORDER — AMOXICILLIN-POT CLAVULANATE 875-125 MG PO TABS
1.0000 | ORAL_TABLET | Freq: Two times a day (BID) | ORAL | Status: DC
  Filled 2011-08-25 (×2): qty 1

## 2011-08-25 MED ORDER — IBUPROFEN 600 MG PO TABS: 600.0000 mg | ORAL_TABLET | Freq: Four times a day (QID) | ORAL | Status: DC

## 2011-08-25 NOTE — Progress Notes (Signed)
Please order Tdap if needed prior to discharge.

## 2011-08-25 NOTE — Progress Notes (Signed)
Subjective: Postpartum Day 2: Cesarean Delivery Patient reports incisional pain.    Objective: Vital signs in last 24 hours: Temp:  [97.5 F (36.4 C)-98.9 F (37.2 C)] 98.9 F (37.2 C) (02/12 0529) Pulse Rate:  [75-106] 106  (02/12 0529) Resp:  [18-20] 20  (02/12 0529) BP: (98-109)/(64-70) 104/67 mmHg (02/12 0529) SpO2:  [97 %] 97 % (02/11 1011)  Physical Exam:  General: alert, no distress. Lochia: appropriate Uterine Fundus: firm, tender Incision: healing well DVT Evaluation: No evidence of DVT seen on physical exam.   Basename 08/23/11 0506  HGB 9.5*  HCT --    Assessment/Plan: Status post Cesarean section. Doing well postoperatively.  Endometritis.  Improved, but still tender.  Continue IV antibiotics. Continue current care.  Calistro Rauf A 08/25/2011, 9:11 AM

## 2011-08-25 NOTE — Discharge Summary (Signed)
Obstetric Discharge Summary Reason for Admission: induction of labor for postdates. Prenatal Procedures: NST and ultrasound Intrapartum Procedures: cesarean: low cervical, transverse Postpartum Procedures: antibiotics Complications-Operative and Postpartum: endometritis Hemoglobin  Date Value Range Status  08/23/2011 9.5* 12.0-15.0 (g/dL) Final     DELTA CHECK NOTED     REPEATED TO VERIFY     HCT  Date Value Range Status  08/21/2011 38.0  36.0-46.0 (%) Final    Discharge Diagnoses: Post-date pregnancy and post op C/S with endometritis.  Improved after IV antibiotics.  Discharge Information: Date: 08/25/2011 Activity: pelvic rest Diet: routine Medications: PNV, Ibuprofen, Percocet and Augmentin. Condition: improved Instructions: refer to practice specific booklet Discharge to: home Follow-up Information    Follow up with [name] A, MD. Schedule an appointment as soon as possible for a visit in 2 weeks.   Contact information:   [address] [place] [address] [phone]          Newborn Data: Live born female  Birth Weight: 7 lb 15 oz (3600 g) APGAR: 8, 9  Home with mother.  Renea Schoonmaker A 08/25/2011, 2:49 PM

## 2012-09-14 ENCOUNTER — Other Ambulatory Visit (HOSPITAL_COMMUNITY): Payer: Self-pay | Admitting: Obstetrics

## 2012-09-14 DIAGNOSIS — Z3689 Encounter for other specified antenatal screening: Secondary | ICD-10-CM

## 2012-09-15 ENCOUNTER — Other Ambulatory Visit (HOSPITAL_COMMUNITY): Payer: Self-pay | Admitting: Obstetrics

## 2012-09-15 DIAGNOSIS — Z3689 Encounter for other specified antenatal screening: Secondary | ICD-10-CM

## 2012-09-21 ENCOUNTER — Encounter (HOSPITAL_COMMUNITY): Payer: Self-pay

## 2012-09-21 ENCOUNTER — Ambulatory Visit (HOSPITAL_COMMUNITY)
Admission: RE | Admit: 2012-09-21 | Discharge: 2012-09-21 | Disposition: A | Discharge status: Home / Self Care | Payer: Self-pay | Source: Ambulatory Visit | Attending: Obstetrics | Admitting: Obstetrics

## 2012-09-21 DIAGNOSIS — Z363 Encounter for antenatal screening for malformations: Secondary | ICD-10-CM | POA: Insufficient documentation

## 2012-09-21 DIAGNOSIS — O2620 Pregnancy care for patient with recurrent pregnancy loss, unspecified trimester: Secondary | ICD-10-CM | POA: Insufficient documentation

## 2012-09-21 DIAGNOSIS — Z1389 Encounter for screening for other disorder: Secondary | ICD-10-CM | POA: Insufficient documentation

## 2012-09-21 DIAGNOSIS — O358XX0 Maternal care for other (suspected) fetal abnormality and damage, not applicable or unspecified: Secondary | ICD-10-CM | POA: Insufficient documentation

## 2012-09-21 DIAGNOSIS — Z3689 Encounter for other specified antenatal screening: Secondary | ICD-10-CM

## 2012-09-21 DIAGNOSIS — O34219 Maternal care for unspecified type scar from previous cesarean delivery: Secondary | ICD-10-CM | POA: Insufficient documentation

## 2012-09-21 NOTE — Progress Notes (Signed)
Kayla Strickland  was seen today for an ultrasound appointment.  See full report in [facility].  Impression: Single IUP at 21 4/7 weeks Normal detailed fetal anatomy No markers associated with aneuploidy noted Normal amniotic fluid volume  Recommendations: Follow-up ultrasounds as clinically indicated.   Alpha Gula, MD

## 2013-01-03 ENCOUNTER — Other Ambulatory Visit: Payer: Self-pay | Admitting: Obstetrics

## 2013-01-20 NOTE — H&P (Signed)
NAME:  Kayla Strickland, Kayla Strickland             ACCOUNT NO.:  MEDICAL RECORD NO.:  [id number]  LOCATION:                                 FACILITY:  PHYSICIAN:  Kathreen Cosier, M.D.DATE OF BIRTH:  1976/05/23  DATE OF ADMISSION: DATE OF DISCHARGE:                             HISTORY & PHYSICAL   The patient is a 37 year old, 37 year old, gravida 37, para 1-0-6-1, her due date is January 28, 2013.  She had 1 previous C-section.  She had an ectopic with a left salpingo-oophorectomy and she had 6 spontaneous abortions, and she is now at 39 plus weeks in for repeat C-section and desires tubal ligation.  This pregnancy has been uncomplicated.  PAST MEDICAL HISTORY:  Negative.  PAST SURGICAL HISTORY:  Left salpingo oophorectomy, she said from an ectopic.  She had a C-section in the past.  SOCIAL HISTORY:  Negative.  SYSTEM REVIEW:  Noncontributory.  PHYSICAL EXAMINATION:  GENERAL:  A well-developed female in no distress. HEENT:  Negative. LUNGS:  Clear to P and A. BREASTS:  Negative. HEART:  Regular rhythm.  No murmurs, no gallops. ABDOMEN:  Term. PELVIC:  As described above. EXTREMITIES:  Negative.          ______________________________ Kathreen Cosier, M.D.     BAM/MEDQ  D:  01/19/2013  T:  01/20/2013  Job:  [id number]

## 2013-01-23 ENCOUNTER — Encounter (HOSPITAL_COMMUNITY)
Admission: RE | Admit: 2013-01-23 | Discharge: 2013-01-23 | Disposition: A | Discharge status: Home / Self Care | Payer: Self-pay | Source: Ambulatory Visit | Attending: Obstetrics | Admitting: Obstetrics

## 2013-01-23 ENCOUNTER — Encounter (HOSPITAL_COMMUNITY): Payer: Self-pay

## 2013-01-23 DIAGNOSIS — Z01812 Encounter for preprocedural laboratory examination: Secondary | ICD-10-CM | POA: Insufficient documentation

## 2013-01-23 DIAGNOSIS — Z01818 Encounter for other preprocedural examination: Secondary | ICD-10-CM | POA: Insufficient documentation

## 2013-01-23 HISTORY — DX: Other specified health status: Z78.9

## 2013-01-23 LAB — CBC
MCV: 96 fL (ref 78.0–100.0)
Platelets: 180 10*3/uL (ref 150–400)
RBC: 4.02 MIL/uL (ref 3.87–5.11)
WBC: 7.3 10*3/uL (ref 4.0–10.5)

## 2013-01-23 LAB — RAPID HIV SCREEN (WH-MAU): Rapid HIV Screen: NONREACTIVE

## 2013-01-23 NOTE — Patient Instructions (Addendum)
20 Kayla Strickland  01/23/2013   Your procedure is scheduled on:  01/24/13  Enter through the Main Entrance of [facility] at 945 AM.  Pick up the phone at the desk and dial [phone].   Call this number if you have problems the morning of surgery: [phone]   Remember:   Do not eat food:After Midnight.  Do not drink clear liquids: After Midnight.  Take these medicines the morning of surgery with A SIP OF WATER: NA   Do not wear jewelry, make-up or nail polish.  Do not wear lotions, powders, or perfumes. You may wear deodorant.  Do not shave 48 hours prior to surgery.  Do not bring valuables to the hospital.  [facility] is not responsible                  for any belongings or valuables brought to the hospital.  Contacts, dentures or bridgework may not be worn into surgery.  Leave suitcase in the car. After surgery it may be brought to your room.  For patients admitted to the hospital, checkout time is 11:00 AM the day of                discharge.   Patients discharged the day of surgery will not be allowed to drive                   home.  Name and phone number of your driver: NA  Special Instructions: Shower using CHG 2 nights before surgery and the night before surgery.  If you shower the day of surgery use CHG.  Use special wash - you have one bottle of CHG for all showers.  You should use approximately 1/3 of the bottle for each shower.   Please read over the following fact sheets that you were given: Surgical Site Infection Prevention

## 2013-01-24 ENCOUNTER — Encounter (HOSPITAL_COMMUNITY)
Admission: RE | Disposition: A | Discharge status: Home / Self Care | Payer: Self-pay | Source: Ambulatory Visit | Attending: Obstetrics

## 2013-01-24 ENCOUNTER — Encounter (HOSPITAL_COMMUNITY): Payer: Self-pay | Admitting: Anesthesiology

## 2013-01-24 ENCOUNTER — Encounter (HOSPITAL_COMMUNITY): Payer: Self-pay | Admitting: *Deleted

## 2013-01-24 ENCOUNTER — Inpatient Hospital Stay (HOSPITAL_COMMUNITY)
Admission: RE | Admit: 2013-01-24 | Discharge: 2013-01-26 | DRG: 766 | Disposition: A | Discharge status: Home / Self Care | Payer: Medicaid Other | Source: Ambulatory Visit | Attending: Obstetrics | Admitting: Obstetrics

## 2013-01-24 ENCOUNTER — Inpatient Hospital Stay (HOSPITAL_COMMUNITY): Payer: Medicaid Other | Admitting: Anesthesiology

## 2013-01-24 DIAGNOSIS — Z302 Encounter for sterilization: Secondary | ICD-10-CM

## 2013-01-24 DIAGNOSIS — O34219 Maternal care for unspecified type scar from previous cesarean delivery: Principal | ICD-10-CM | POA: Diagnosis present

## 2013-01-24 LAB — PREPARE RBC (CROSSMATCH)

## 2013-01-24 SURGERY — Surgical Case
Anesthesia: Spinal | Site: Abdomen | Wound class: Clean Contaminated

## 2013-01-24 MED ORDER — KETOROLAC TROMETHAMINE 30 MG/ML IJ SOLN: 30.0000 mg | Freq: Four times a day (QID) | INTRAMUSCULAR | Status: DC | PRN

## 2013-01-24 MED ORDER — LACTATED RINGERS IV SOLN
INTRAVENOUS | Status: DC
  Administered 2013-01-25: 02:00:00 via INTRAVENOUS

## 2013-01-24 MED ORDER — ZOLPIDEM TARTRATE 5 MG PO TABS: 5.0000 mg | ORAL_TABLET | Freq: Every evening | ORAL | Status: DC | PRN

## 2013-01-24 MED ORDER — MORPHINE SULFATE 0.5 MG/ML IJ SOLN
INTRAMUSCULAR | Status: AC
  Filled 2013-01-24: qty 10

## 2013-01-24 MED ORDER — NALOXONE HCL 1 MG/ML IJ SOLN
1.0000 ug/kg/h | INTRAVENOUS | Status: DC | PRN
  Filled 2013-01-24: qty 2

## 2013-01-24 MED ORDER — NALOXONE HCL 0.4 MG/ML IJ SOLN: 0.4000 mg | INTRAMUSCULAR | Status: DC | PRN

## 2013-01-24 MED ORDER — SENNOSIDES-DOCUSATE SODIUM 8.6-50 MG PO TABS
2.0000 | ORAL_TABLET | Freq: Every day | ORAL | Status: DC
  Administered 2013-01-24 – 2013-01-25 (×2): 2 via ORAL

## 2013-01-24 MED ORDER — WITCH HAZEL-GLYCERIN EX PADS: 1.0000 "application " | MEDICATED_PAD | CUTANEOUS | Status: DC | PRN

## 2013-01-24 MED ORDER — FENTANYL CITRATE 0.05 MG/ML IJ SOLN
INTRAMUSCULAR | Status: AC
  Administered 2013-01-24: 50 ug via INTRAVENOUS
  Filled 2013-01-24: qty 2

## 2013-01-24 MED ORDER — PRENATAL MULTIVITAMIN CH
1.0000 | ORAL_TABLET | Freq: Every day | ORAL | Status: DC
  Administered 2013-01-25 – 2013-01-26 (×2): 1 via ORAL
  Filled 2013-01-24 (×2): qty 1

## 2013-01-24 MED ORDER — FENTANYL CITRATE 0.05 MG/ML IJ SOLN
INTRAMUSCULAR | Status: DC | PRN
  Administered 2013-01-24: 12.5 ug via INTRATHECAL

## 2013-01-24 MED ORDER — ONDANSETRON HCL 4 MG/2ML IJ SOLN
INTRAMUSCULAR | Status: DC | PRN
  Administered 2013-01-24: 4 mg via INTRAVENOUS

## 2013-01-24 MED ORDER — MORPHINE SULFATE (PF) 0.5 MG/ML IJ SOLN
INTRAMUSCULAR | Status: DC | PRN
Start: 2013-01-24 — End: 2013-01-24
  Administered 2013-01-24: .2 mg via INTRATHECAL

## 2013-01-24 MED ORDER — MEPERIDINE HCL 25 MG/ML IJ SOLN: 6.2500 mg | INTRAMUSCULAR | Status: DC | PRN

## 2013-01-24 MED ORDER — LANOLIN HYDROUS EX OINT: 1.0000 "application " | TOPICAL_OINTMENT | CUTANEOUS | Status: DC | PRN

## 2013-01-24 MED ORDER — OXYTOCIN 40 UNITS IN LACTATED RINGERS INFUSION - SIMPLE MED: 62.5000 mL/h | INTRAVENOUS | Status: AC

## 2013-01-24 MED ORDER — FENTANYL CITRATE 0.05 MG/ML IJ SOLN: 25.0000 ug | INTRAMUSCULAR | Status: DC | PRN

## 2013-01-24 MED ORDER — DIPHENHYDRAMINE HCL 25 MG PO CAPS
25.0000 mg | ORAL_CAPSULE | Freq: Four times a day (QID) | ORAL | Status: DC | PRN
  Administered 2013-01-26: 25 mg via ORAL

## 2013-01-24 MED ORDER — KETOROLAC TROMETHAMINE 30 MG/ML IJ SOLN
INTRAMUSCULAR | Status: AC
  Administered 2013-01-24: 30 mg via INTRAVENOUS
  Filled 2013-01-24: qty 1

## 2013-01-24 MED ORDER — ONDANSETRON HCL 4 MG PO TABS: 4.0000 mg | ORAL_TABLET | ORAL | Status: DC | PRN

## 2013-01-24 MED ORDER — FENTANYL CITRATE 0.05 MG/ML IJ SOLN
INTRAMUSCULAR | Status: AC
  Filled 2013-01-24: qty 2

## 2013-01-24 MED ORDER — ONDANSETRON HCL 4 MG/2ML IJ SOLN
INTRAMUSCULAR | Status: AC
  Filled 2013-01-24: qty 2

## 2013-01-24 MED ORDER — SCOPOLAMINE 1 MG/3DAYS TD PT72
MEDICATED_PATCH | TRANSDERMAL | Status: AC
  Administered 2013-01-24: 1.5 mg via TRANSDERMAL
  Filled 2013-01-24: qty 1

## 2013-01-24 MED ORDER — BUPIVACAINE IN DEXTROSE 0.75-8.25 % IT SOLN
INTRATHECAL | Status: DC | PRN
  Administered 2013-01-24: 1.4 mL via INTRATHECAL

## 2013-01-24 MED ORDER — DIPHENHYDRAMINE HCL 50 MG/ML IJ SOLN: 25.0000 mg | INTRAMUSCULAR | Status: DC | PRN

## 2013-01-24 MED ORDER — PRENATAL MULTIVITAMIN CH: 1.0000 | ORAL_TABLET | Freq: Every day | ORAL | Status: DC

## 2013-01-24 MED ORDER — MENTHOL 3 MG MT LOZG: 1.0000 | LOZENGE | OROMUCOSAL | Status: DC | PRN

## 2013-01-24 MED ORDER — MIDAZOLAM HCL 2 MG/2ML IJ SOLN: 0.5000 mg | Freq: Once | INTRAMUSCULAR | Status: DC | PRN

## 2013-01-24 MED ORDER — LACTATED RINGERS IV SOLN
Freq: Once | INTRAVENOUS | Status: AC
Start: 2013-01-24 — End: 2013-01-24
  Administered 2013-01-24 (×4): via INTRAVENOUS

## 2013-01-24 MED ORDER — CEFAZOLIN SODIUM-DEXTROSE 2-3 GM-% IV SOLR
2.0000 g | Freq: Once | INTRAVENOUS | Status: AC
  Administered 2013-01-24: 2 g via INTRAVENOUS

## 2013-01-24 MED ORDER — SIMETHICONE 80 MG PO CHEW: 80.0000 mg | CHEWABLE_TABLET | ORAL | Status: DC | PRN

## 2013-01-24 MED ORDER — DIPHENHYDRAMINE HCL 25 MG PO CAPS
25.0000 mg | ORAL_CAPSULE | ORAL | Status: DC | PRN
  Filled 2013-01-24: qty 1

## 2013-01-24 MED ORDER — OXYTOCIN 10 UNIT/ML IJ SOLN
INTRAMUSCULAR | Status: AC
  Filled 2013-01-24: qty 4

## 2013-01-24 MED ORDER — PHENYLEPHRINE HCL 10 MG/ML IJ SOLN
INTRAMUSCULAR | Status: DC | PRN
  Administered 2013-01-24 (×5): 40 ug via INTRAVENOUS

## 2013-01-24 MED ORDER — DIBUCAINE 1 % RE OINT: 1.0000 "application " | TOPICAL_OINTMENT | RECTAL | Status: DC | PRN

## 2013-01-24 MED ORDER — SIMETHICONE 80 MG PO CHEW
80.0000 mg | CHEWABLE_TABLET | Freq: Three times a day (TID) | ORAL | Status: DC
  Administered 2013-01-24 – 2013-01-26 (×6): 80 mg via ORAL

## 2013-01-24 MED ORDER — ONDANSETRON HCL 4 MG/2ML IJ SOLN: 4.0000 mg | Freq: Three times a day (TID) | INTRAMUSCULAR | Status: DC | PRN

## 2013-01-24 MED ORDER — CEFAZOLIN SODIUM-DEXTROSE 2-3 GM-% IV SOLR
INTRAVENOUS | Status: AC
  Filled 2013-01-24: qty 50

## 2013-01-24 MED ORDER — IBUPROFEN 600 MG PO TABS
600.0000 mg | ORAL_TABLET | Freq: Four times a day (QID) | ORAL | Status: DC
  Administered 2013-01-24 – 2013-01-26 (×7): 600 mg via ORAL
  Filled 2013-01-24 (×7): qty 1

## 2013-01-24 MED ORDER — SODIUM CHLORIDE 0.9 % IJ SOLN: 3.0000 mL | INTRAMUSCULAR | Status: DC | PRN

## 2013-01-24 MED ORDER — METOCLOPRAMIDE HCL 5 MG/ML IJ SOLN: 10.0000 mg | Freq: Three times a day (TID) | INTRAMUSCULAR | Status: DC | PRN

## 2013-01-24 MED ORDER — OXYTOCIN 10 UNIT/ML IJ SOLN
INTRAMUSCULAR | Status: DC | PRN
  Administered 2013-01-24: 40 [IU] via INTRAMUSCULAR

## 2013-01-24 MED ORDER — DIPHENHYDRAMINE HCL 50 MG/ML IJ SOLN: 12.5000 mg | INTRAMUSCULAR | Status: DC | PRN

## 2013-01-24 MED ORDER — PROMETHAZINE HCL 25 MG/ML IJ SOLN: 6.2500 mg | INTRAMUSCULAR | Status: DC | PRN

## 2013-01-24 MED ORDER — EPHEDRINE SULFATE 50 MG/ML IJ SOLN
INTRAMUSCULAR | Status: DC | PRN
  Administered 2013-01-24 (×5): 10 mg via INTRAVENOUS

## 2013-01-24 MED ORDER — SCOPOLAMINE 1 MG/3DAYS TD PT72
1.0000 | MEDICATED_PATCH | Freq: Once | TRANSDERMAL | Status: DC
  Administered 2013-01-24: 1.5 mg via TRANSDERMAL

## 2013-01-24 MED ORDER — OXYCODONE-ACETAMINOPHEN 5-325 MG PO TABS
1.0000 | ORAL_TABLET | ORAL | Status: DC | PRN
  Administered 2013-01-25 – 2013-01-26 (×4): 1 via ORAL
  Filled 2013-01-24: qty 1
  Filled 2013-01-24: qty 2
  Filled 2013-01-24 (×3): qty 1

## 2013-01-24 MED ORDER — ACETAMINOPHEN 10 MG/ML IV SOLN
1000.0000 mg | Freq: Four times a day (QID) | INTRAVENOUS | Status: AC | PRN
  Filled 2013-01-24: qty 100

## 2013-01-24 MED ORDER — TETANUS-DIPHTH-ACELL PERTUSSIS 5-2.5-18.5 LF-MCG/0.5 IM SUSP
0.5000 mL | Freq: Once | INTRAMUSCULAR | Status: AC
  Administered 2013-01-26: 0.5 mL via INTRAMUSCULAR
  Filled 2013-01-24: qty 0.5

## 2013-01-24 MED ORDER — ONDANSETRON HCL 4 MG/2ML IJ SOLN: 4.0000 mg | INTRAMUSCULAR | Status: DC | PRN

## 2013-01-24 MED ORDER — SCOPOLAMINE 1 MG/3DAYS TD PT72: 1.0000 | MEDICATED_PATCH | Freq: Once | TRANSDERMAL | Status: DC

## 2013-01-24 MED ORDER — NALBUPHINE SYRINGE 5 MG/0.5 ML
5.0000 mg | INJECTION | INTRAMUSCULAR | Status: DC | PRN
  Filled 2013-01-24: qty 1

## 2013-01-24 MED ORDER — LACTATED RINGERS IV SOLN
INTRAVENOUS | Status: DC
  Administered 2013-01-24: 10:00:00 via INTRAVENOUS

## 2013-01-24 SURGICAL SUPPLY — 33 items
ADH SKN CLS APL DERMABOND .7 (GAUZE/BANDAGES/DRESSINGS) ×2
CLAMP CORD UMBIL (MISCELLANEOUS) IMPLANT
CLOTH BEACON ORANGE TIMEOUT ST (SAFETY) ×3 IMPLANT
CONTAINER PREFILL 10% NBF 15ML (MISCELLANEOUS) ×6 IMPLANT
DERMABOND ADVANCED (GAUZE/BANDAGES/DRESSINGS) ×1
DERMABOND ADVANCED .7 DNX12 (GAUZE/BANDAGES/DRESSINGS) ×2 IMPLANT
DRAPE LG THREE QUARTER DISP (DRAPES) ×3 IMPLANT
DRSG OPSITE POSTOP 4X10 (GAUZE/BANDAGES/DRESSINGS) ×3 IMPLANT
DURAPREP 26ML APPLICATOR (WOUND CARE) ×3 IMPLANT
ELECT REM PT RETURN 9FT ADLT (ELECTROSURGICAL) ×3
ELECTRODE REM PT RTRN 9FT ADLT (ELECTROSURGICAL) ×2 IMPLANT
EXTRACTOR VACUUM M CUP 4 TUBE (SUCTIONS) IMPLANT
GLOVE BIO SURGEON STRL SZ8.5 (GLOVE) ×3 IMPLANT
GOWN PREVENTION PLUS XXLARGE (GOWN DISPOSABLE) ×3 IMPLANT
GOWN STRL REIN XL XLG (GOWN DISPOSABLE) ×6 IMPLANT
KIT ABG SYR 3ML LUER SLIP (SYRINGE) IMPLANT
NDL HYPO 25X5/8 SAFETYGLIDE (NEEDLE) IMPLANT
NEEDLE HYPO 25X5/8 SAFETYGLIDE (NEEDLE) IMPLANT
NS IRRIG 1000ML POUR BTL (IV SOLUTION) ×3 IMPLANT
PACK C SECTION WH (CUSTOM PROCEDURE TRAY) ×3 IMPLANT
PAD OB MATERNITY 4.3X12.25 (PERSONAL CARE ITEMS) ×3 IMPLANT
SUT CHROMIC 0 CT 802H (SUTURE) ×3 IMPLANT
SUT CHROMIC 1 CTX 36 (SUTURE) ×6 IMPLANT
SUT CHROMIC 2 0 SH (SUTURE) ×3 IMPLANT
SUT GUT PLAIN 0 CT-3 TAN 27 (SUTURE) ×3 IMPLANT
SUT MON AB 4-0 PS1 27 (SUTURE) ×3 IMPLANT
SUT VIC AB 0 CT1 18XCR BRD8 (SUTURE) IMPLANT
SUT VIC AB 0 CT1 8-18 (SUTURE)
SUT VIC AB 0 CTX 36 (SUTURE) ×6
SUT VIC AB 0 CTX36XBRD ANBCTRL (SUTURE) ×4 IMPLANT
TOWEL OR 17X24 6PK STRL BLUE (TOWEL DISPOSABLE) ×9 IMPLANT
TRAY FOLEY CATH 14FR (SET/KITS/TRAYS/PACK) ×3 IMPLANT
WATER STERILE IRR 1000ML POUR (IV SOLUTION) ×3 IMPLANT

## 2013-01-24 NOTE — Anesthesia Procedure Notes (Signed)
Spinal  Patient location during procedure: OR Start time: 01/24/2013 11:25 AM End time: 01/24/2013 11:27 AM Staffing Anesthesiologist: Sandrea Hughs Performed by: anesthesiologist  Preanesthetic Checklist Completed: patient identified, surgical consent, pre-op evaluation, timeout performed, IV checked, risks and benefits discussed and monitors and equipment checked Spinal Block Patient position: sitting Prep: DuraPrep Patient monitoring: heart rate, cardiac monitor, continuous pulse ox and blood pressure Approach: midline Location: L3-4 Injection technique: single-shot Needle Needle type: Sprotte  Needle gauge: 24 G Needle length: 9 cm Needle insertion depth: 5 cm Assessment Sensory level: T4

## 2013-01-24 NOTE — Anesthesia Postprocedure Evaluation (Signed)
Anesthesia Post Note  Patient: Kayla Strickland  Procedure(s) Performed: Procedure(s) (LRB): CESAREAN SECTION (N/A)  Anesthesia type: Spinal  Patient location: Mother/Baby  Post pain: Pain level controlled  Post assessment: Post-op Vital signs reviewed  Last Vitals:  Filed Vitals:   01/24/13 1334  BP:   Pulse: 74  Temp:   Resp: 20    Post vital signs: Reviewed  Level of consciousness: awake  Complications: No apparent anesthesia complications

## 2013-01-24 NOTE — Transfer of Care (Signed)
Immediate Anesthesia Transfer of Care Note  Patient: Kayla Strickland  Procedure(s) Performed: Procedure(s) with comments: CESAREAN SECTION (N/A) - repeat cesarean section with removal of a portion of the Right fallopian tube  Patient Location: PACU  Anesthesia Type:Spinal  Level of Consciousness: awake, alert  and oriented  Airway & Oxygen Therapy: Patient Spontanous Breathing  Post-op Assessment: Report given to PACU RN and Post -op Vital signs reviewed and stable  Post vital signs: Reviewed and stable  Complications: No apparent anesthesia complications

## 2013-01-24 NOTE — Anesthesia Postprocedure Evaluation (Signed)
Anesthesia Post Note  Patient: Kayla Strickland  Procedure(s) Performed: Procedure(s) (LRB): CESAREAN SECTION (N/A)  Anesthesia type: Spinal  Patient location: PACU  Post pain: Pain level controlled  Post assessment: Post-op Vital signs reviewed  Last Vitals:  Filed Vitals:   01/24/13 1245  BP: 107/46  Pulse: 90  Temp:   Resp: 20    Post vital signs: Reviewed  Level of consciousness: awake  Complications: No apparent anesthesia complications

## 2013-01-24 NOTE — Anesthesia Preprocedure Evaluation (Signed)

## 2013-01-24 NOTE — Op Note (Signed)
preop diagnosis previous cesarean section at term multiparity desires repeat C-section and tubal ligation Postop diagnosis the same Surgeon Dr. Francoise Ceo First assistant Dr. Coral Ceo Anesthesia spinal Procedure patient placed on the operating table in the supine position after the spinal administered abdomen prepped and draped bladder emptied with a Foley catheter a transverse suprapubic incision made through the old scar carried to the rectus fascia fascia cleaned and incised the length of the incision recti muscles retracted laterally peritoneum incised longitudinally transverse incision made on the visceroperitoneum above the bladder bladder mobilized inferiorly a transverse low uterine incision made fluid clear patient delivered from the LOA position of a female Apgar 8 and 9 team in attendance the placenta was removed manually it was posterior uterine cavity clean with dry laps the uterine incision closed in one layer with continuous   one chromic the right tube was grasped in midportion and a  with a Babcock clamp and 0 plain suture placed in the meso  salpinx below the portion of tube within theThis was tied an approximately 1 inch of tube transected on the left side the tube was absent the ovary was present she had a salpingectomy from a previous ectopic lap and sponge counts correct abdomen closed in layers peritoneum continuous with 2-0 chromic fascia continuous suture of 0 Dexon skin shows a subcuticular stitch of 4-0 Monocryl blood loss was 1000 cc patient tolerated the procedure well and and

## 2013-01-24 NOTE — H&P (Signed)
There has been no change in the history and physical since the  Original    dictation

## 2013-01-24 NOTE — Consult Note (Signed)
Neonatology Note:  Attendance at C-section:  I was asked by Dr. Marshall to attend this repeat C/S at term. The mother is a [id number] O pos, GBS neg with previous ectopic pregnancy and subsequent multiple SAB. ROM at delivery, fluid clear. Infant vigorous with good spontaneous cry and tone. Needed only minimal bulb suctioning. Ap 8/9. Lungs clear to ausc in DR. To CN to care of Pediatrician.  Kayla Weathers C. Jaeden Messer, MD  

## 2013-01-25 ENCOUNTER — Encounter (HOSPITAL_COMMUNITY): Payer: Self-pay | Admitting: Obstetrics

## 2013-01-25 LAB — CBC
Hemoglobin: 9.9 g/dL — ABNORMAL LOW (ref 12.0–15.0)
MCHC: 33.7 g/dL (ref 30.0–36.0)

## 2013-01-25 LAB — BIRTH TISSUE RECOVERY COLLECTION (PLACENTA DONATION)

## 2013-01-25 NOTE — Progress Notes (Signed)
UR chart review completed.  

## 2013-01-25 NOTE — Progress Notes (Signed)
Patient ID: Kayla Strickland, female   DOB: 10/26/1975, 37 y.o.   MRN: [id number] Postop day 1 Vital signs normal Incision clean and dry Legs negative Doing well and and

## 2013-01-26 LAB — TYPE AND SCREEN
Antibody Screen: NEGATIVE
Unit division: 0

## 2013-01-26 NOTE — Discharge Summary (Signed)
Obstetric Discharge Summary Reason for Admission: cesarean section Prenatal Procedures: none Intrapartum Procedures: cesarean: low cervical, transverse Postpartum Procedures: P.P. tubal ligation Complications-Operative and Postpartum: none Hemoglobin  Date Value Range Status  01/25/2013 9.9* 12.0 - 15.0 g/dL Final     DELTA CHECK NOTED     REPEATED TO VERIFY     HCT  Date Value Range Status  01/25/2013 29.1* 36.0 - 46.0 % Final    Physical Exam:  General: alert Lochia: appropriate Uterine Fundus: firm Incision: healing well DVT Evaluation: No evidence of DVT seen on physical exam.  Discharge Diagnoses: Term Pregnancy-delivered  Discharge Information: Date: 01/26/2013 Activity: pelvic rest Diet: routine Medications: Percocet Condition: stable Instructions: refer to practice specific booklet Discharge to: home Follow-up Information   Follow up with [name], MD. Schedule an appointment as soon as possible for a visit in 6 weeks.   Contact information:   [address] [place] [address] [phone]       Newborn Data: Live born female  Birth Weight: 7 lb 12 oz (3515 g) APGAR: 8, 9  Home with mother.  Kayla Strickland A 01/26/2013, 6:43 AM

## 2013-01-26 NOTE — Progress Notes (Signed)
Patient ID: Kayla Strickland, female   DOB: 11-25-1975, 37 y.o.   MRN: [id number] Postop day 2 Vital signs normal Abdomen soft Incision clean and dry Legs negative Wants early discharge today and

## 2013-02-10 DEATH — deceased

## 2014-05-14 ENCOUNTER — Encounter (HOSPITAL_COMMUNITY): Payer: Self-pay | Admitting: Obstetrics

## 2020-07-03 ENCOUNTER — Other Ambulatory Visit: Payer: Self-pay | Admitting: Obstetrics and Gynecology

## 2020-07-09 ENCOUNTER — Other Ambulatory Visit: Payer: Self-pay | Admitting: Obstetrics and Gynecology

## 2020-07-09 DIAGNOSIS — N6310 Unspecified lump in the right breast, unspecified quadrant: Secondary | ICD-10-CM

## 2020-07-22 ENCOUNTER — Other Ambulatory Visit: Payer: Self-pay

## 2020-07-22 DIAGNOSIS — N6310 Unspecified lump in the right breast, unspecified quadrant: Secondary | ICD-10-CM

## 2020-07-30 ENCOUNTER — Ambulatory Visit: Payer: Self-pay

## 2020-07-30 ENCOUNTER — Other Ambulatory Visit: Payer: Self-pay

## 2020-08-01 ENCOUNTER — Ambulatory Visit
Admission: RE | Admit: 2020-08-01 | Discharge: 2020-08-01 | Disposition: A | Discharge status: Home / Self Care | Payer: No Typology Code available for payment source | Source: Ambulatory Visit | Attending: Obstetrics and Gynecology | Admitting: Obstetrics and Gynecology

## 2020-08-01 ENCOUNTER — Ambulatory Visit: Payer: Self-pay | Admitting: *Deleted

## 2020-08-01 ENCOUNTER — Other Ambulatory Visit: Payer: Self-pay

## 2020-08-01 VITALS — BP 108/78 | Wt 146.6 lb

## 2020-08-01 DIAGNOSIS — N644 Mastodynia: Secondary | ICD-10-CM

## 2020-08-01 DIAGNOSIS — N6320 Unspecified lump in the left breast, unspecified quadrant: Secondary | ICD-10-CM

## 2020-08-01 DIAGNOSIS — N6310 Unspecified lump in the right breast, unspecified quadrant: Secondary | ICD-10-CM

## 2020-08-01 DIAGNOSIS — Z1239 Encounter for other screening for malignant neoplasm of breast: Secondary | ICD-10-CM

## 2020-08-01 DIAGNOSIS — N6312 Unspecified lump in the right breast, upper inner quadrant: Secondary | ICD-10-CM

## 2020-08-01 IMAGING — US US BREAST*R* LIMITED INC AXILLA
1 series · 8 of 8 positions shown · non-contrast
Comparison: None.

CLINICAL DATA: 44-year-old presenting with a possible palpable lump
in the UPPER INNER periareolar RIGHT breast which was noticed
initially approximately 1 month ago. This is the patient's initial
baseline mammogram.

EXAM:
DIGITAL DIAGNOSTIC BILATERAL MAMMOGRAM WITH CAD AND TOMOSYNTHESIS
ULTRASOUND RIGHT BREAST
TECHNIQUE: Bilateral digital diagnostic mammography and breast tomosynthesis
was performed. Digital images of the breasts were evaluated with
computer-aided detection. Targeted ultrasound examination of the
RIGHT breast was performed.

[Series 1: us breast*right* limited inc axilla · 0.06mm/px · 8 of 8 slices shown]
[im 1/8]
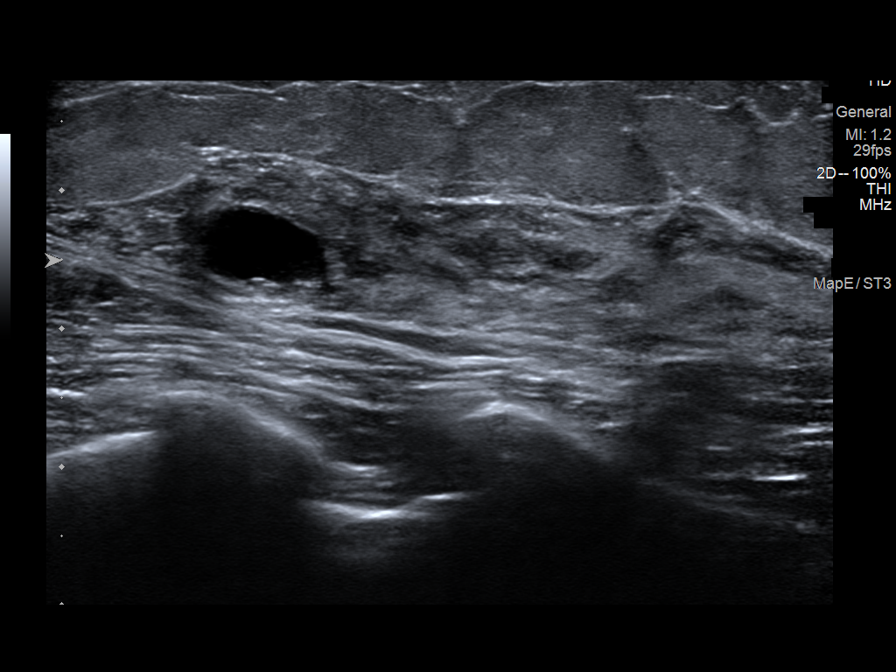
[im 2/8]
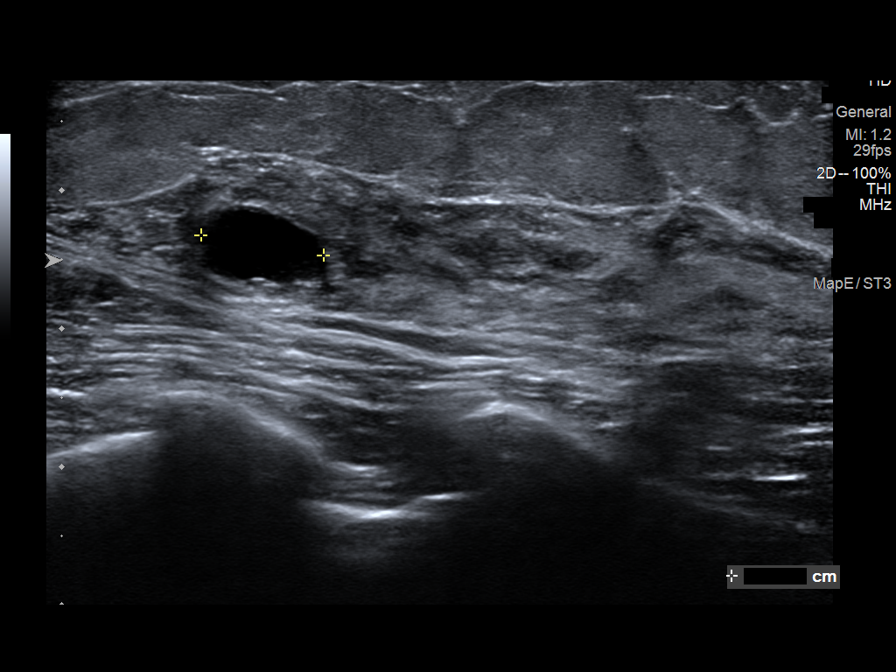
[im 3/8]
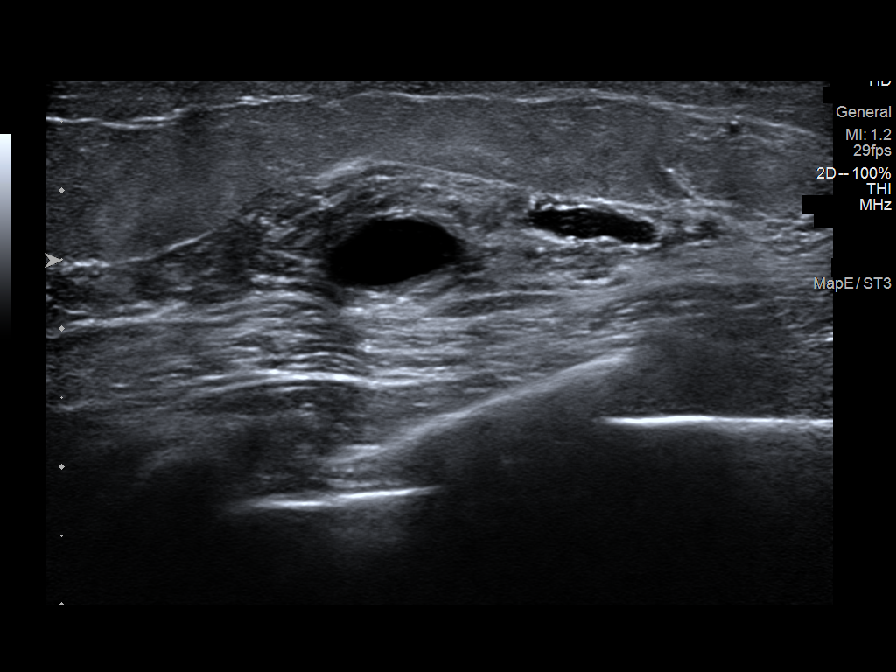
[im 4/8]
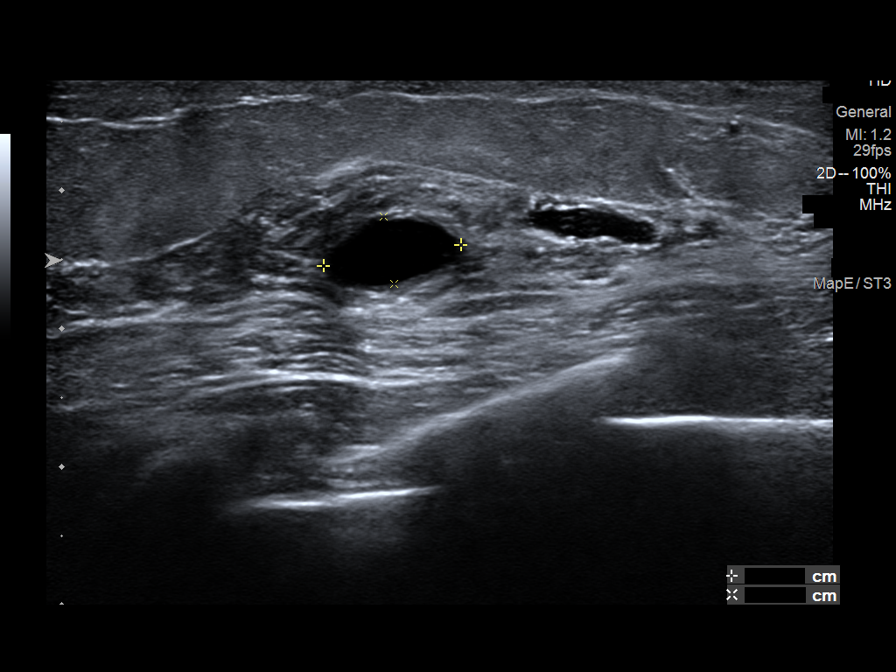
[im 5/8]
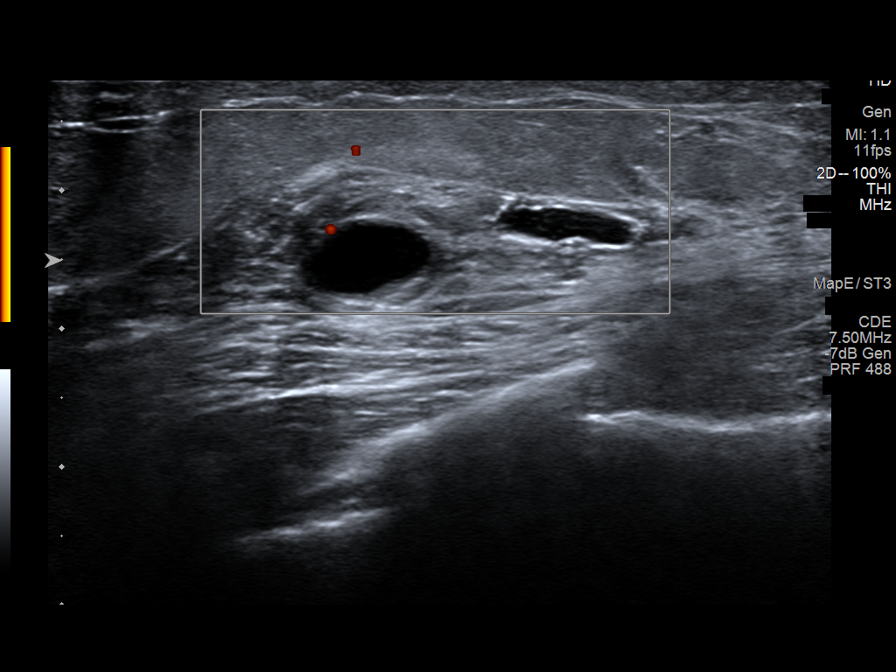
[im 6/8]
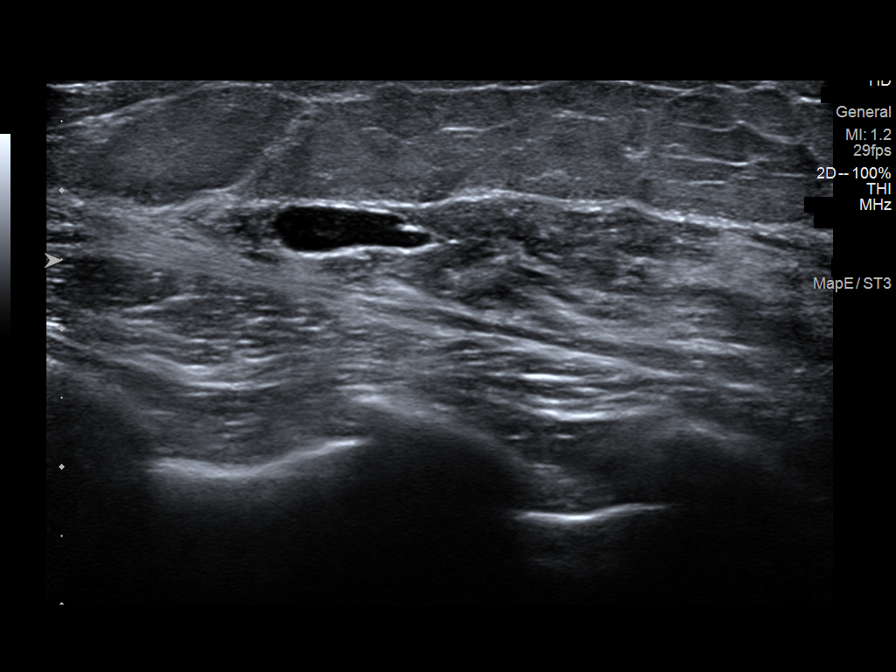
[im 7/8]
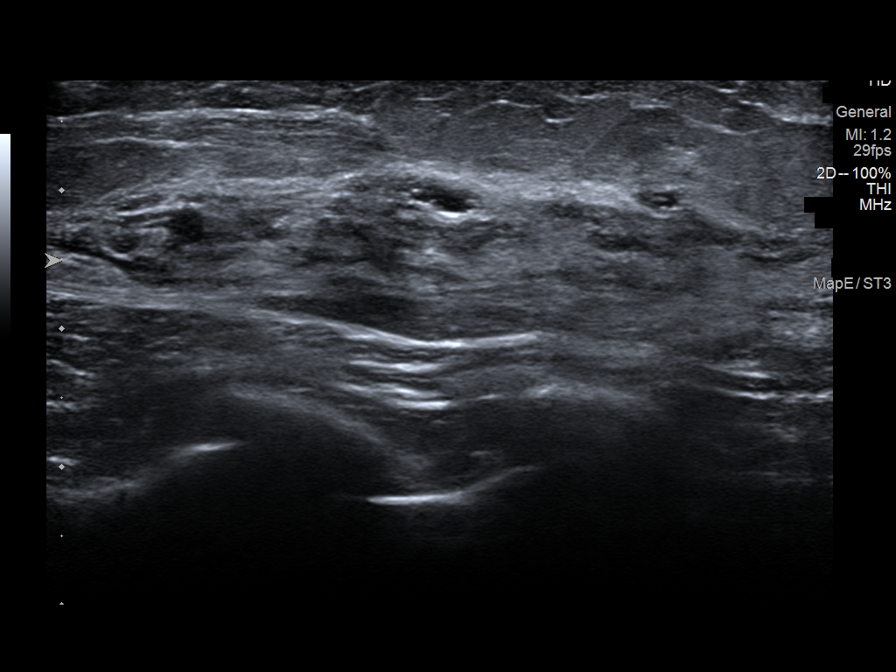
[im 8/8]
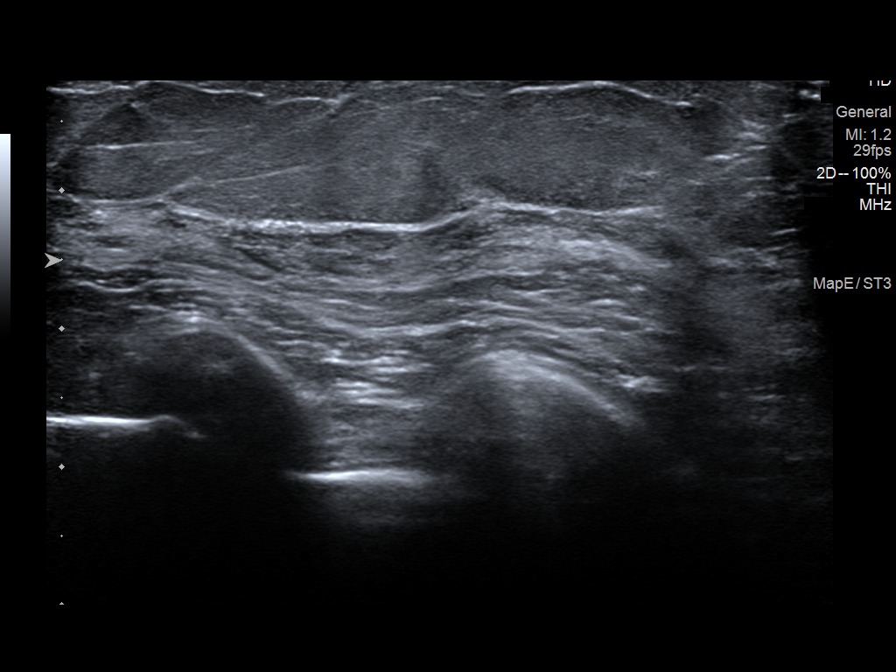

[8 of 8 positions shown; findings below may reference images not displayed]

ACR Breast Density Category d: The breast tissue is extremely dense,
which lowers the sensitivity of mammography.
FINDINGS: RIGHT: No mammographic abnormality in the area of palpable concern
in the UPPER INNER periareolar location. No findings suspicious for
malignancy.

Targeted ultrasound is performed, showing adjacent benign cysts at
the 1 o'clock position approximately 4 cm from the nipple. The
larger cyst measures approximately 1.0 x 0.5 x 0.9 cm. The smaller
adjacent cyst contains scattered internal echoes. No suspicious
solid mass or abnormal acoustic shadowing is identified.

LEFT: No findings suspicious for malignancy.

On correlative physical exam, the tissues of the UPPER INNER
QUADRANT have a "lumpy bumpy" texture, though I do not palpate a
discrete mass.
IMPRESSION: 1. No mammographic or sonographic evidence of malignancy involving
the RIGHT breast.
2. No mammographic evidence of malignancy involving the LEFT breast.
3. Benign cysts in the UPPER INNER QUADRANT of the RIGHT breast.

RECOMMENDATION:
Screening mammogram in one year.(Code:[S0])

I have discussed the findings and recommendations with the patient.
If applicable, a reminder letter will be sent to the patient
regarding the next appointment.

BI-RADS CATEGORY  2: Benign.

## 2020-08-01 NOTE — Progress Notes (Signed)
Kayla Strickland is a 45 y.o. female who presents to [facility] clinic today with complaint of bilateral breast lumps since December 2021. Patient stated the right one has increased in size. Complaints of bilateral diffuse breast pain. Patient rated pain at a 4-5 out of 10 right breast and  3 out of 10 left breast.   Pap Smear: Pap smear not completed today. Last Pap smear was in December 2020 at [facility] clinic and was normal per patient. Per patient has no history of an abnormal Pap smear. Last Pap smear result is not available in Epic.   Physical exam: Breasts Breasts symmetrical. No skin abnormalities bilateral breasts. No nipple retraction bilateral breasts. No nipple discharge bilateral breasts. No lymphadenopathy. No lumps palpated left breast. Palpated a bb sized lump within the right breast at 2 o'clock 4 cm from the nipple. Complaints of right diffuse breast pain on exam. Complaints of left upper and inner breast pain on exam.    Pelvic/Bimanual Pap is not indicated today per [facility]CP guidelines.    Smoking History: Patient is a former smoker that smoked while a teenage and quit before she was 45 years old.   Patient Navigation: Patient education provided. Access to services provided for patient through [facility]CP program.    Breast and Cervical Cancer Risk Assessment: Patient does not have family history of breast cancer, known genetic mutations, or radiation treatment to the chest before age 58. Patient does not have history of cervical dysplasia, immunocompromised, or DES exposure in-utero.  Risk Assessment    Risk Scores      08/01/2020   Last edited by: Meryl Dare, CMA   5-year risk: 0.6 %   Lifetime risk: 9.5 %         A: B[facility]CP exam without pap smear Complaint of right breast lump and bilateral breast pain.  P: Referred patient to the [facility] for a diagnostic mammogram. Appointment scheduled Thursday, August 01, 2020 at 1450.   Priscille Heidelberg, RN 08/01/2020 1:50 PM

## 2020-08-01 NOTE — Patient Instructions (Signed)
Explained breast self awareness with [name]. Patient did not need a Pap smear today due to last Pap smear was in December 2020 per patient. Let her know [facility]CP will cover Pap smears every 3 years unless has a history of abnormal Pap smears. Referred patient to the [facility] for a diagnostic mammogram. Appointment scheduled Thursday, August 01, 2020 at 1450. Patient aware of appointment and will be there. Kayla Strickland verbalized understanding.  Zollie Ellery, Kathaleen Maser, RN 1:50 PM

## 2023-09-13 ENCOUNTER — Other Ambulatory Visit: Payer: Self-pay

## 2023-09-13 ENCOUNTER — Encounter (HOSPITAL_BASED_OUTPATIENT_CLINIC_OR_DEPARTMENT_OTHER): Payer: Self-pay | Admitting: Emergency Medicine

## 2023-09-13 ENCOUNTER — Inpatient Hospital Stay (HOSPITAL_COMMUNITY)

## 2023-09-13 ENCOUNTER — Observation Stay (HOSPITAL_BASED_OUTPATIENT_CLINIC_OR_DEPARTMENT_OTHER)
Admission: EM | Admit: 2023-09-13 | Discharge: 2023-09-16 | Disposition: A | Discharge status: Home / Self Care | Attending: Student | Admitting: Student

## 2023-09-13 ENCOUNTER — Emergency Department (HOSPITAL_BASED_OUTPATIENT_CLINIC_OR_DEPARTMENT_OTHER)

## 2023-09-13 DIAGNOSIS — I639 Cerebral infarction, unspecified: Secondary | ICD-10-CM | POA: Diagnosis not present

## 2023-09-13 DIAGNOSIS — Z1152 Encounter for screening for COVID-19: Secondary | ICD-10-CM | POA: Insufficient documentation

## 2023-09-13 DIAGNOSIS — E871 Hypo-osmolality and hyponatremia: Secondary | ICD-10-CM | POA: Insufficient documentation

## 2023-09-13 DIAGNOSIS — E876 Hypokalemia: Secondary | ICD-10-CM | POA: Diagnosis not present

## 2023-09-13 DIAGNOSIS — R112 Nausea with vomiting, unspecified: Secondary | ICD-10-CM | POA: Insufficient documentation

## 2023-09-13 DIAGNOSIS — F1090 Alcohol use, unspecified, uncomplicated: Secondary | ICD-10-CM | POA: Diagnosis not present

## 2023-09-13 DIAGNOSIS — Z8742 Personal history of other diseases of the female genital tract: Secondary | ICD-10-CM | POA: Diagnosis not present

## 2023-09-13 DIAGNOSIS — R42 Dizziness and giddiness: Secondary | ICD-10-CM | POA: Diagnosis present

## 2023-09-13 DIAGNOSIS — E785 Hyperlipidemia, unspecified: Secondary | ICD-10-CM | POA: Insufficient documentation

## 2023-09-13 DIAGNOSIS — Z79899 Other long term (current) drug therapy: Secondary | ICD-10-CM | POA: Diagnosis not present

## 2023-09-13 DIAGNOSIS — R519 Headache, unspecified: Secondary | ICD-10-CM | POA: Insufficient documentation

## 2023-09-13 DIAGNOSIS — Z7901 Long term (current) use of anticoagulants: Secondary | ICD-10-CM | POA: Insufficient documentation

## 2023-09-13 DIAGNOSIS — D500 Iron deficiency anemia secondary to blood loss (chronic): Secondary | ICD-10-CM | POA: Diagnosis not present

## 2023-09-13 DIAGNOSIS — I7774 Dissection of vertebral artery: Secondary | ICD-10-CM | POA: Diagnosis not present

## 2023-09-13 DIAGNOSIS — I63211 Cerebral infarction due to unspecified occlusion or stenosis of right vertebral arteries: Secondary | ICD-10-CM | POA: Diagnosis not present

## 2023-09-13 DIAGNOSIS — D649 Anemia, unspecified: Secondary | ICD-10-CM | POA: Diagnosis not present

## 2023-09-13 LAB — BASIC METABOLIC PANEL
Anion gap: 8 (ref 5–15)
BUN: 9 mg/dL (ref 6–20)
CO2: 21 mmol/L — ABNORMAL LOW (ref 22–32)
Calcium: 9.6 mg/dL (ref 8.9–10.3)
Chloride: 109 mmol/L (ref 98–111)
Creatinine, Ser: 0.61 mg/dL (ref 0.44–1.00)
GFR, Estimated: >60 mL/min (ref >60)
Glucose, Bld: 96 mg/dL (ref 70–99)
Potassium: 4.1 mmol/L (ref 3.5–5.1)
Sodium: 138 mmol/L (ref 135–145)

## 2023-09-13 LAB — URINALYSIS, ROUTINE W REFLEX MICROSCOPIC
Bilirubin Urine: NEGATIVE
Glucose, UA: NEGATIVE mg/dL
Hgb urine dipstick: NEGATIVE
Ketones, ur: NEGATIVE mg/dL
Leukocytes,Ua: NEGATIVE
Nitrite: NEGATIVE
Protein, ur: NEGATIVE mg/dL
Specific Gravity, Urine: 1.007 (ref 1.005–1.030)
pH: 6 (ref 5.0–8.0)

## 2023-09-13 LAB — RAPID URINE DRUG SCREEN, HOSP PERFORMED
Amphetamines: NOT DETECTED
Barbiturates: NOT DETECTED
Benzodiazepines: NOT DETECTED
Cocaine: NOT DETECTED
Opiates: NOT DETECTED
Tetrahydrocannabinol: NOT DETECTED

## 2023-09-13 LAB — RESP PANEL BY RT-PCR (RSV, FLU A&B, COVID)  RVPGX2
Influenza A by PCR: NEGATIVE
Influenza B by PCR: NEGATIVE
Resp Syncytial Virus by PCR: NEGATIVE
SARS Coronavirus 2 by RT PCR: NEGATIVE

## 2023-09-13 LAB — CBC
HCT: 31.6 % — ABNORMAL LOW (ref 36.0–46.0)
Hemoglobin: 9 g/dL — ABNORMAL LOW (ref 12.0–15.0)
MCH: 20 pg — ABNORMAL LOW (ref 26.0–34.0)
MCHC: 28.5 g/dL — ABNORMAL LOW (ref 30.0–36.0)
MCV: 70.1 fL — ABNORMAL LOW (ref 80.0–100.0)
Platelets: 405 10*3/uL — ABNORMAL HIGH (ref 150–400)
RBC: 4.51 MIL/uL (ref 3.87–5.11)
RDW: 18.2 % — ABNORMAL HIGH (ref 11.5–15.5)
WBC: 7 10*3/uL (ref 4.0–10.5)
nRBC: 0 % (ref 0.0–0.2)

## 2023-09-13 LAB — APTT: aPTT: 28 s (ref 24–36)

## 2023-09-13 LAB — CBG MONITORING, ED: Glucose-Capillary: 66 mg/dL — ABNORMAL LOW (ref 70–99)

## 2023-09-13 LAB — PREGNANCY, URINE: Preg Test, Ur: NEGATIVE

## 2023-09-13 LAB — PROTIME-INR
INR: 1 (ref 0.8–1.2)
Prothrombin Time: 12.9 s (ref 11.4–15.2)

## 2023-09-13 MED ORDER — CLOPIDOGREL BISULFATE 300 MG PO TABS
300.0000 mg | ORAL_TABLET | Freq: Once | ORAL | Status: AC
  Administered 2023-09-13: 300 mg via ORAL
  Filled 2023-09-13: qty 1

## 2023-09-13 MED ORDER — SODIUM CHLORIDE 0.9 % IV BOLUS
1000.0000 mL | Freq: Once | INTRAVENOUS | Status: AC
  Administered 2023-09-13: 1000 mL via INTRAVENOUS

## 2023-09-13 MED ORDER — ASPIRIN 325 MG PO TBEC
650.0000 mg | DELAYED_RELEASE_TABLET | Freq: Once | ORAL | Status: AC
  Administered 2023-09-13: 650 mg via ORAL
  Filled 2023-09-13: qty 2

## 2023-09-13 MED ORDER — ONDANSETRON HCL 4 MG/2ML IJ SOLN
4.0000 mg | Freq: Once | INTRAMUSCULAR | Status: AC
  Administered 2023-09-13: 4 mg via INTRAVENOUS
  Filled 2023-09-13: qty 2

## 2023-09-13 MED ORDER — IOHEXOL 350 MG/ML SOLN
100.0000 mL | Freq: Once | INTRAVENOUS | Status: AC | PRN
  Administered 2023-09-13: 100 mL via INTRAVENOUS

## 2023-09-13 NOTE — Consult Note (Signed)
 NEUROLOGY CONSULT NOTE   Date of service: September 13, 2023 Patient Name: Kayla Strickland MRN:  [id number] DOB:  1975/12/27 Chief Complaint: Vertigo, headache and listing to the right Requesting Provider: Tereasa Coop, MD  History of Present Illness  Kayla Strickland is a 48 y.o. female with a PMHx of endometriosis who presented initially to [facility] for assessment of acute onset vertigo evolving to right sided headache with right neck pain, N/V and listing to the right. First symptom noted was vertigo of sudden onset on Saturday evening while relaxing at home with family. The vertigo was exacerbated by movement. She went to bed and on Sunday noted that the vertigo was worse, as well as a new-onset right sided non-throbbing headache that was severe and rated as 10/10. The headache evolved to include radiation of the pain down the right side of her face and jaw, with subsequent onset of right neck pain as well. On Monday she started to have an unsteady gait, with listing to the right as well as continued vertigo and headache. She then experienced nausea with vomiting and decided to be evaluated in the ED.   CTA was obtained, which was concerning for a vertebral artery dissection on the right. No acute hemorrhage or stroke was seen within the brain parenchyma. She was loaded with ASA 650mg  and Plavix 300mg  in the [facility] ED. She has been transferred to [facility] for further management.   She denies any recent sudden or sharp head or neck movements, no recent MVA/trauma and no recent straining or lifting of heavy objects.     ROS  Her vertigo is worsened with movement and abates when lying still. Headache is now rated as 3/10, involving her left forehead/periorbital region and right temple, radiating down the right side of her face and involving her right jawline. Also with right sided neck pain. Comprehensive ROS performed and pertinent positives are otherwise as documented in the HPI    Past History   Past  Medical History:  Diagnosis Date   Endometriosis    Medical history non-contributory     Past Surgical History:  Procedure Laterality Date   CESAREAN SECTION  08/22/2011   Procedure: CESAREAN SECTION;  Surgeon: Roseanna Rainbow, MD;  Location: [facility];  Service: Gynecology;  Laterality: N/A;  Primary cesarean section with delivery of baby girl at 29. Apgars 8/9.   CESAREAN SECTION N/A 01/24/2013   Procedure: CESAREAN SECTION;  Surgeon: Kathreen Cosier, MD;  Location: [facility];  Service: Obstetrics;  Laterality: N/A;  repeat cesarean section with removal of a portion of the Right fallopian tube   LAPAROSCOPIC ENDOMETRIOSIS FULGURATION     OOPHORECTOMY      Family History: History reviewed. No pertinent family history.  Social History  reports that she has never smoked. She has never used smokeless tobacco. She reports current alcohol use. She reports that she does not use drugs.  No Known Allergies  Medications  No current facility-administered medications for this encounter.  Vitals   Vitals:   09/13/23 1900 09/13/23 1905 09/13/23 1930 09/13/23 2140  BP: 100/89  104/72 110/77  Pulse: 76  65 68  Resp: (!) 22  13 18   Temp:  98.2 F (36.8 C)  98.8 F (37.1 C)  TempSrc:  Oral  Oral  SpO2: 100%  100% 100%  Weight:    68 kg  Height:    5\' 4"  (1.626 m)    Body mass index is 25.75 kg/m.  Physical Exam  Physical Exam  HEENT-  [place]/AT    Lungs- Respirations unlabored Extremities- No edema   Neurological Examination Mental Status: Alert, oriented x 5, thought content appropriate.  Speech fluent without evidence of aphasia.  Able to follow all commands without difficulty. Cranial Nerves: II: Temporal visual fields intact with no extinction to DSS. PERRL  III,IV, VI: No ptosis. EOMI. No nystagmus.  V: Temp sensation equal bilaterally  VII: Smile symmetric VIII: Hearing intact to voice IX,X: No hoarseness XI: Symmetric shoulder shrug XII: Midline tongue  extension Motor: BUE 5/5 proximally and distally, except for 4+/5 right deltoid and biceps weakness BLE 5/5 proximally and distally  No pronator drift.  Sensory: Temp and light touch intact throughout, bilaterally. No extinction to DSS.  Deep Tendon Reflexes: 2+ and symmetric throughout Cerebellar: No ataxia with FNF, RAM and H-S bilaterally  Gait: Deferred   Labs/Imaging/Neurodiagnostic studies   CBC:  Recent Labs  Lab 2023-10-03 1056  WBC 7.0  HGB 9.0*  HCT 31.6*  MCV 70.1*  PLT 405*   Basic Metabolic Panel:  Lab Results  Component Value Date   NA 138 2023-10-03   K 4.1 Oct 03, 2023   CO2 21 (L) 10-03-23   GLUCOSE 96 10-03-2023   BUN 9 10/03/2023   CREATININE 0.61 2023/10/03   CALCIUM 9.6 10-03-23   GFRNONAA >60 03-Oct-2023   GFRAA >90 08/23/2011   Lipid Panel: No results found for: "LDLCALC" HgbA1c: No results found for: "HGBA1C" Urine Drug Screen:     Component Value Date/Time   LABOPIA NONE DETECTED 10/03/2023 1653   COCAINSCRNUR NONE DETECTED Oct 03, 2023 1653   LABBENZ NONE DETECTED October 03, 2023 1653   AMPHETMU NONE DETECTED Oct 03, 2023 1653   THCU NONE DETECTED 03-Oct-2023 1653   LABBARB NONE DETECTED 10-03-23 1653    Alcohol Level No results found for: "ETH" INR  Lab Results  Component Value Date   INR 1.0 October 03, 2023   APTT  Lab Results  Component Value Date   APTT 28 10/03/23     CT angio Head and Neck with contrast (Personally reviewed): 1. Diminished caliber and opacification of the right vertebral artery from the origin to the distal V2 segment. 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 2. No acute intracranial hemorrhage.   MRI Brain (Personally reviewed): 1. Small area of edema or encephalomalacia in the infero-medial right cerebellum. Findings are concerning for an acute infarct given findings on same day CTA and reported  symptoms; however, unfortunately this cannot be confirmed given this area is obscured on diffusion-weighted imaging by artifact from the patient's braces. 2. Otherwise, no visible acute abnormality on this limited MRI.   ASSESSMENT  48 year old female with a PMHx of endometriosis who presented initially to [facility] for assessment of acute onset vertigo evolving to right sided headache with right neck pain, N/V and listing to the right. First symptom noted was vertigo of sudden onset on Saturday evening while relaxing at home with family. The vertigo was exacerbated by movement. She went to bed and on Sunday noted that the vertigo was worse, as well as a new-onset right sided non-throbbing headache that was severe and rated as 10/10. The headache evolved to include radiation of the pain down the right side of her face and jaw, with subsequent onset of right neck pain as well. On Monday she started to have an unsteady gait, with listing to the right as well as continued vertigo and  headache. She then experienced nausea with vomiting and decided to be evaluated in the ED.  CTA revealed findings concerning for right vertebral artery dissection.  - Exam reveals subtle weakness of right deltoid and biceps. No ataxia or nystagmus noted.  - MRI brain: Findings are concerning for a small acute infarct involving the infero-medial right cerebellar hemisphere.  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   RECOMMENDATIONS  - Although she has a history of menorrhagia with high doses of ASA (used PRN for pain) in the past, benefits of antiplatelet therapy for prevention of stroke in the setting of her right vertebral artery dissection significantly outweigh the risks.  - ASA and Plavix were loaded in the ED - She should continue ASA 81 mg po every day and Plavix 75 mg po every day for 6 months, followed by repeat CTA of head and neck to assess for  possible resolution of her dissection.  - No sudden neck movements or sharp turning/flexion of the neck for at least the next 6 months.  - Can get a TTE, but expect this to be low-yield.  - Cardiac Telemetry.  - Permissive HTN not indicated due to symptom onset > 24 hours ago and presence of dissection which could worsen if she becomes hypertensive - PT/OT/Speech - HgbA1c, fasting lipid panel - Frequent neuro checks - NPO until passes stroke swallow screen - Stroke Team to follow in the morning.    ______________________________________________________________________    Dessa Phi, Johany Hansman, MD [facility]

## 2023-09-13 NOTE — ED Triage Notes (Signed)
 Pt c/o dizziness, denies HA x 3 days pta. Also reports HA, n/v and shob

## 2023-09-13 NOTE — ED Notes (Signed)
 Purple man green.

## 2023-09-13 NOTE — ED Notes (Signed)
[place] with  cl called for transport 

## 2023-09-13 NOTE — ED Notes (Signed)
 Complaining of leaning to the right, slurred speech (according to family) and dizziness/headache. Worsens dizziness looking to the right.

## 2023-09-13 NOTE — H&P (Signed)
 History and Physical    Kayla Strickland GNF:[id number] DOB: 03/08/1976 DOA: 09/13/2023  Patient coming from: Home.  Chief Complaint: Vertigo.  HPI: Kayla Strickland is a 48 y.o. female with history of endometriosis has been experiencing vertigo and headache for the last 3 days.  Patient's symptoms started on March 1 Saturday while watching television.  Patient started developing dizziness and subsequently started having headache on the right side.  This persisted and got worse.  Had episodes of nausea vomiting.  Denies any weakness of the extremities.  She felt the room was spinning around.  Patient presents to the ER.  ED Course: In the ER CT angiogram head and neck shows concerning features for right vertebral artery dissection.  MRI of the brain shows possible right cerebellar infarct.  Neurology was consulted.  Patient is placed on aspirin and Plavix loading dose admitted for further workup.  EKG shows normal sinus rhythm.  Blood work showed hemoglobin of 9 pregnancy screen negative.  Review of Systems: As per HPI, rest all negative.   Past Medical History:  Diagnosis Date   Endometriosis    Medical history non-contributory     Past Surgical History:  Procedure Laterality Date   CESAREAN SECTION  08/22/2011   Procedure: CESAREAN SECTION;  Surgeon: Roseanna Rainbow, MD;  Location: [facility];  Service: Gynecology;  Laterality: N/A;  Primary cesarean section with delivery of baby girl at 75. Apgars 8/9.   CESAREAN SECTION N/A 01/24/2013   Procedure: CESAREAN SECTION;  Surgeon: Kathreen Cosier, MD;  Location: [facility];  Service: Obstetrics;  Laterality: N/A;  repeat cesarean section with removal of a portion of the Right fallopian tube   LAPAROSCOPIC ENDOMETRIOSIS FULGURATION     OOPHORECTOMY       reports that she has never smoked. She has never used smokeless tobacco. She reports current alcohol use. She reports that she does not use drugs.  No Known Allergies  History reviewed.  No pertinent family history.  Prior to Admission medications   Medication Sig Start Date End Date Taking? Authorizing Provider  Prenatal Vit-Fe Fumarate-FA (PRENATAL MULTIVITAMIN) TABS Take 1 tablet by mouth daily. Patient not taking: Reported on 08/01/2020    [provider]    Physical Exam: Constitutional: Moderately built and nourished. Vitals:   09/13/23 1900 09/13/23 1905 09/13/23 1930 09/13/23 2140  BP: 100/89  104/72 110/77  Pulse: 76  65 68  Resp: (!) 22  13 18   Temp:  98.2 F (36.8 C)  98.8 F (37.1 C)  TempSrc:  Oral  Oral  SpO2: 100%  100% 100%  Weight:    68 kg  Height:    5\' 4"  (1.626 m)   Eyes: Anicteric no pallor. ENMT: No discharge from the ears eyes nose or mouth. Neck: No mass felt.  No neck rigidity. Respiratory: No rhonchi or crepitations. Cardiovascular: S1-S2 heard. Abdomen: Soft nontender bowel sound present. Musculoskeletal: No edema. Skin: No rash. Neurologic: Alert awake oriented to time place and person.  Moves all extremities 5 x 5.  No facial asymmetry tongue is midline pupils equal reacting to light. Psychiatric: Appears normal.  Normal affect.   Labs on Admission: I have personally reviewed following labs and imaging studies  CBC: Recent Labs  Lab 09/13/23 1056  WBC 7.0  HGB 9.0*  HCT 31.6*  MCV 70.1*  PLT 405*   Basic Metabolic Panel: Recent Labs  Lab 09/13/23 1056  NA 138  K 4.1  CL 109  CO2 21*  GLUCOSE 96  BUN 9  CREATININE 0.61  CALCIUM 9.6   GFR: Estimated Creatinine Clearance: 84.1 mL/min (by C-G formula based on SCr of 0.61 mg/dL). Liver Function Tests: No results for input(s): "AST", "ALT", "ALKPHOS", "BILITOT", "PROT", "ALBUMIN" in the last 168 hours. No results for input(s): "LIPASE", "AMYLASE" in the last 168 hours. No results for input(s): "AMMONIA" in the last 168 hours. Coagulation Profile: Recent Labs  Lab 09/13/23 1653  INR 1.0   Cardiac Enzymes: No results for input(s): "CKTOTAL",  "CKMB", "CKMBINDEX", "TROPONINI" in the last 168 hours. BNP (last 3 results) No results for input(s): "PROBNP" in the last 8760 hours. HbA1C: No results for input(s): "HGBA1C" in the last 72 hours. CBG: Recent Labs  Lab 09/13/23 1229  GLUCAP 66*   Lipid Profile: No results for input(s): "CHOL", "HDL", "LDLCALC", "TRIG", "CHOLHDL", "LDLDIRECT" in the last 72 hours. Thyroid Function Tests: No results for input(s): "TSH", "T4TOTAL", "FREET4", "T3FREE", "THYROIDAB" in the last 72 hours. Anemia Panel: No results for input(s): "VITAMINB12", "FOLATE", "FERRITIN", "TIBC", "IRON", "RETICCTPCT" in the last 72 hours. Urine analysis:    Component Value Date/Time   COLORURINE YELLOW 09/13/2023 1056   APPEARANCEUR HAZY (A) 09/13/2023 1056   LABSPEC 1.007 09/13/2023 1056   PHURINE 6.0 09/13/2023 1056   GLUCOSEU NEGATIVE 09/13/2023 1056   HGBUR NEGATIVE 09/13/2023 1056   BILIRUBINUR NEGATIVE 09/13/2023 1056   KETONESUR NEGATIVE 09/13/2023 1056   PROTEINUR NEGATIVE 09/13/2023 1056   UROBILINOGEN 0.2 01/31/2010 1411   NITRITE NEGATIVE 09/13/2023 1056   LEUKOCYTESUR NEGATIVE 09/13/2023 1056   Sepsis Labs: @LABRCNTIP (procalcitonin:4,lacticidven:4) ) Recent Results (from the past 240 hours)  Resp panel by RT-PCR (RSV, Flu A&B, Covid) Anterior Nasal Swab     Status: None   Collection Time: 09/13/23 10:50 AM   Specimen: Anterior Nasal Swab  Result Value Ref Range Status   SARS Coronavirus 2 by RT PCR NEGATIVE NEGATIVE Final    Comment: (NOTE) SARS-CoV-2 target nucleic acids are NOT DETECTED.  The SARS-CoV-2 RNA is generally detectable in upper respiratory specimens during the acute phase of infection. The lowest concentration of SARS-CoV-2 viral copies this assay can detect is 138 copies/mL. A negative result does not preclude SARS-Cov-2 infection and should not be used as the sole basis for treatment or other patient management decisions. A negative result may occur with  improper  specimen collection/handling, submission of specimen other than nasopharyngeal swab, presence of viral mutation(s) within the areas targeted by this assay, and inadequate number of viral copies(<138 copies/mL). A negative result must be combined with clinical observations, patient history, and epidemiological information. The expected result is Negative.  Fact Sheet for Patients:  BloggerCourse.com  Fact Sheet for Healthcare Providers:  SeriousBroker.it  This test is no t yet approved or cleared by the [place] FDA and  has been authorized for detection and/or diagnosis of SARS-CoV-2 by FDA under an Emergency Use Authorization (EUA). This EUA will remain  in effect (meaning this test can be used) for the duration of the COVID-19 declaration under Section 564(b)(1) of the Act, 21 U.S.C.section 360bbb-3(b)(1), unless the authorization is terminated  or revoked sooner.       Influenza A by PCR NEGATIVE NEGATIVE Final   Influenza B by PCR NEGATIVE NEGATIVE Final    Comment: (NOTE) The Xpert Xpress SARS-CoV-2/FLU/RSV plus assay is intended as an aid in the diagnosis of influenza from Nasopharyngeal swab specimens and should not be used as a sole basis for treatment. Nasal washings and aspirates are unacceptable for  Xpert Xpress SARS-CoV-2/FLU/RSV testing.  Fact Sheet for Patients: BloggerCourse.com  Fact Sheet for Healthcare Providers: SeriousBroker.it  This test is not yet approved or cleared by the [place] FDA and has been authorized for detection and/or diagnosis of SARS-CoV-2 by FDA under an Emergency Use Authorization (EUA). This EUA will remain in effect (meaning this test can be used) for the duration of the COVID-19 declaration under Section 564(b)(1) of the Act, 21 U.S.C. section 360bbb-3(b)(1), unless the authorization is terminated or revoked.     Resp  Syncytial Virus by PCR NEGATIVE NEGATIVE Final    Comment: (NOTE) Fact Sheet for Patients: BloggerCourse.com  Fact Sheet for Healthcare Providers: SeriousBroker.it  This test is not yet approved or cleared by the [place] FDA and has been authorized for detection and/or diagnosis of SARS-CoV-2 by FDA under an Emergency Use Authorization (EUA). This EUA will remain in effect (meaning this test can be used) for the duration of the COVID-19 declaration under Section 564(b)(1) of the Act, 21 U.S.C. section 360bbb-3(b)(1), unless the authorization is terminated or revoked.  Performed at [facility], [address], [place], [place] [address]      Radiological Exams on Admission: CT Angio Head Neck W WO CM Result Date: 09/13/2023 CLINICAL DATA:  Central vertigo, dizziness for several days. EXAM: CT ANGIOGRAPHY HEAD AND NECK WITH AND WITHOUT CONTRAST TECHNIQUE: Multidetector CT imaging of the head and neck was performed using the standard protocol during bolus administration of intravenous contrast. Multiplanar CT image reconstructions and MIPs were obtained to evaluate the vascular anatomy. Carotid stenosis measurements (when applicable) are obtained utilizing NASCET criteria, using the distal internal carotid diameter as the denominator. RADIATION DOSE REDUCTION: This exam was performed according to the departmental dose-optimization program which includes automated exposure control, adjustment of the mA and/or kV according to patient size and/or use of iterative reconstruction technique. CONTRAST:  OMNIPAQUE IOHEXOL 350 MG/ML SOLN COMPARISON:  CT head and CTA neck 02/16/2009 FINDINGS: CT HEAD FINDINGS Brain: No acute intracranial hemorrhage. No CT evidence of acute infarct. No edema, mass effect, or midline shift. The basilar cisterns are patent. Ventricles: Ventricles are normal in size and configuration. Vascular: No  hyperdense vessel. Skull: No acute or aggressive finding. Sinuses/orbits: The orbits are symmetric. Minimal secretions in the right sphenoid sinus. Other: Mastoid air cells are clear. CTA NECK FINDINGS Aortic arch: Standard configuration of the aortic arch. Imaged portion shows no evidence of aneurysm or dissection. No significant stenosis of the major arch vessel origins. Pulmonary arteries: As permitted by contrast timing, there are no filling defects in the visualized pulmonary arteries. Subclavian arteries: The subclavian arteries are patent bilaterally. Right carotid system: No evidence of dissection, stenosis (50% or greater), or occlusion. Left carotid system: No evidence of dissection, stenosis (50% or greater), or occlusion. Vertebral arteries: The left vertebral artery is patent from the origin to the vertebrobasilar confluence. There is diminished caliber and diminished contrast within the right vertebral artery from the origin to the distal V2 segment. There is significantly diminished caliber and ventral occlusion of the right vertebral artery at the proximal V3 segment just distal to the C2 transverse foramen. The right vertebral artery is occluded from the proximal V3 segment to the distal V4 segment. Skeleton: No acute or aggressive finding noted. Other neck: The visualized airway is patent. No cervical lymphadenopathy. Mildly heterogeneous appearance of the thyroid with multiple subcentimeter nodules noted. Upper chest: Visualized lung apices are clear. Review of the MIP images confirms the above  findings CTA HEAD FINDINGS ANTERIOR CIRCULATION: The intracranial ICAs are patent bilaterally. No significant stenosis, proximal occlusion, aneurysm, or vascular malformation. MCAs: The middle cerebral arteries are patent bilaterally. ACAs: The anterior cerebral arteries are patent bilaterally. POSTERIOR CIRCULATION: No significant stenosis, proximal occlusion, aneurysm, or vascular malformation. PCAs: The  posterior cerebral arteries are patent bilaterally. Pcomm: Not well visualized. SCAs: The superior cerebellar arteries are patent bilaterally. Basilar artery: Patent AICAs: Patent PICAs: Patent on the left. There are diminutive branches of the right PICA noted. Venous sinuses: As permitted by contrast timing, patent. Anatomic variants: None Review of the MIP images confirms the above findings IMPRESSION: 1. Diminished caliber and opacification of the right vertebral artery from the origin to the distal V2 segment. 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 2. No acute intracranial hemorrhage. Electronically Signed   By: Emily Filbert M.D.   On: 09/13/2023 16:32    EKG: Independently reviewed.  Normal sinus rhythm.  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  Appreciate neurology consult.  Patient has been placed on aspirin and Plavix.  On neurochecks.  Patient passed stroke swallow.  Checking hemoglobin A1c lipid panel.  2D echo. Anemia microcytic hypochromic check anemia panel.  Likely iron deficiency. History of endometriosis.  Since patient has acute CVA with vertebral artery dissection will need close monitoring more than 2 midnight stay and inpatient status.   DVT prophylaxis: Lovenox. Code Status: Full code. Family Communication: Patient's husband at the bedside. Disposition Plan: Monitored bed. Consults called: Neurology. Admission status: Inpatient.

## 2023-09-13 NOTE — ED Provider Notes (Signed)
 [facility] AT [facility] Provider Note   CSN: [id number] Arrival date & time: 09/13/23  1039     History  Chief Complaint  Patient presents with   Dizziness    Kayla Strickland is a 48 y.o. female.  With a history of endometriosis presenting to the ED for evaluation of dizziness.  She states that she developed sudden onset generalized weakness and dizziness 3 days ago.  The dizziness has been intermittent since that time but states that she has continued to feel generally weak.  This morning after she got out of the shower she was walking and noticed that she was leaning to the right side and had to use the wall to brace herself to continue walking.  Yesterday she developed a headache to the left side of her head.  No numbness or tingling.  She has had numerous episodes of vomiting as well.  No fevers or neck stiffness.  No vision changes.  She states she struggles with low blood sugars and typically manages this with peanut butter and orange juice.  Symptoms do not improve after eating.  She is also wondering if this is due to her hemoglobin as she has very heavy menstrual cycles.  She is not currently on her menstrual cycle.  on Saturday she states that she could not remember her daughter's name.   Dizziness Associated symptoms: headaches and weakness        Home Medications Prior to Admission medications   Medication Sig Start Date End Date Taking? Authorizing Provider  Prenatal Vit-Fe Fumarate-FA (PRENATAL MULTIVITAMIN) TABS Take 1 tablet by mouth daily. Patient not taking: Reported on 08/01/2020    [provider]      Allergies    Patient has no known allergies.    Review of Systems   Review of Systems  Neurological:  Positive for dizziness, weakness and headaches.  All other systems reviewed and are negative.   Physical Exam Updated Vital Signs BP 114/78   Pulse 79   Temp 97.9 F (36.6 C) (Oral)   Resp 17   Wt 68 kg   SpO2  100%  Physical Exam Vitals and nursing note reviewed.  Constitutional:      General: She is not in acute distress.    Appearance: She is well-developed.     Comments: Resting comfortably in bed  HENT:     Head: Normocephalic and atraumatic.     Right Ear: Tympanic membrane and ear canal normal.     Left Ear: Tympanic membrane and ear canal normal.  Eyes:     Conjunctiva/sclera: Conjunctivae normal.  Cardiovascular:     Rate and Rhythm: Normal rate and regular rhythm.     Heart sounds: No murmur heard. Pulmonary:     Effort: Pulmonary effort is normal. No respiratory distress.     Breath sounds: Normal breath sounds.  Abdominal:     Palpations: Abdomen is soft.     Tenderness: There is no abdominal tenderness.  Musculoskeletal:        General: No swelling.     Cervical back: Neck supple.  Skin:    General: Skin is warm and dry.     Capillary Refill: Capillary refill takes less than 2 seconds.  Neurological:     Mental Status: She is alert.     Comments:   MENTAL STATUS: AAOx3   LANG/SPEECH: Fluent, intact naming, repetition & comprehension   CRANIAL NERVES:   II: Pupils equal and reactive  III, IV, VI: EOM intact, no gaze preference or deviation, no nystagmus   V: normal sensation of the face   VII: no facial asymmetry   VIII: normal hearing to speech   MOTOR: 5/5 in both upper and lower extremities   SENSORY: Normal to touch in all extremiteis   COORD: Normal finger to nose, heel to shin and shoulder shrug, no tremor, no dysmetria. No pronator drift.  Wide-based gait  Psychiatric:        Mood and Affect: Mood normal.     ED Results / Procedures / Treatments   Labs (all labs ordered are listed, but only abnormal results are displayed) Labs Reviewed  BASIC METABOLIC PANEL - Abnormal; Notable for the following components:      Result Value   CO2 21 (*)    All other components within normal limits  CBC - Abnormal; Notable for the following components:   Hemoglobin  9.0 (*)    HCT 31.6 (*)    MCV 70.1 (*)    MCH 20.0 (*)    MCHC 28.5 (*)    RDW 18.2 (*)    Platelets 405 (*)    All other components within normal limits  URINALYSIS, ROUTINE W REFLEX MICROSCOPIC - Abnormal; Notable for the following components:   APPearance HAZY (*)    Bacteria, UA FEW (*)    All other components within normal limits  CBG MONITORING, ED - Abnormal; Notable for the following components:   Glucose-Capillary 66 (*)    All other components within normal limits  RESP PANEL BY RT-PCR (RSV, FLU A&B, COVID)  RVPGX2  PREGNANCY, URINE  PROTIME-INR  APTT  RAPID URINE DRUG SCREEN, HOSP PERFORMED  ETHANOL    EKG EKG Interpretation Date/Time:  Monday September 13 2023 10:53:53 EST Ventricular Rate:  81 PR Interval:  126 QRS Duration:  76 QT Interval:  376 QTC Calculation: 436 R Axis:   73  Text Interpretation: Normal sinus rhythm Normal ECG similar to prior no STEMI Confirmed by Theda Belfast ([id number]) on 09/13/2023 12:44:40 PM  Radiology CT Angio Head Neck W WO CM Result Date: 09/13/2023 CLINICAL DATA:  Central vertigo, dizziness for several days. EXAM: CT ANGIOGRAPHY HEAD AND NECK WITH AND WITHOUT CONTRAST TECHNIQUE: Multidetector CT imaging of the head and neck was performed using the standard protocol during bolus administration of intravenous contrast. Multiplanar CT image reconstructions and MIPs were obtained to evaluate the vascular anatomy. Carotid stenosis measurements (when applicable) are obtained utilizing NASCET criteria, using the distal internal carotid diameter as the denominator. RADIATION DOSE REDUCTION: This exam was performed according to the departmental dose-optimization program which includes automated exposure control, adjustment of the mA and/or kV according to patient size and/or use of iterative reconstruction technique. CONTRAST:  OMNIPAQUE IOHEXOL 350 MG/ML SOLN COMPARISON:  CT head and CTA neck 02/16/2009 FINDINGS: CT HEAD FINDINGS Brain: No  acute intracranial hemorrhage. No CT evidence of acute infarct. No edema, mass effect, or midline shift. The basilar cisterns are patent. Ventricles: Ventricles are normal in size and configuration. Vascular: No hyperdense vessel. Skull: No acute or aggressive finding. Sinuses/orbits: The orbits are symmetric. Minimal secretions in the right sphenoid sinus. Other: Mastoid air cells are clear. CTA NECK FINDINGS Aortic arch: Standard configuration of the aortic arch. Imaged portion shows no evidence of aneurysm or dissection. No significant stenosis of the major arch vessel origins. Pulmonary arteries: As permitted by contrast timing, there are no filling defects in the visualized pulmonary arteries. Subclavian arteries: The  subclavian arteries are patent bilaterally. Right carotid system: No evidence of dissection, stenosis (50% or greater), or occlusion. Left carotid system: No evidence of dissection, stenosis (50% or greater), or occlusion. Vertebral arteries: The left vertebral artery is patent from the origin to the vertebrobasilar confluence. There is diminished caliber and diminished contrast within the right vertebral artery from the origin to the distal V2 segment. There is significantly diminished caliber and ventral occlusion of the right vertebral artery at the proximal V3 segment just distal to the C2 transverse foramen. The right vertebral artery is occluded from the proximal V3 segment to the distal V4 segment. Skeleton: No acute or aggressive finding noted. Other neck: The visualized airway is patent. No cervical lymphadenopathy. Mildly heterogeneous appearance of the thyroid with multiple subcentimeter nodules noted. Upper chest: Visualized lung apices are clear. Review of the MIP images confirms the above findings CTA HEAD FINDINGS ANTERIOR CIRCULATION: The intracranial ICAs are patent bilaterally. No significant stenosis, proximal occlusion, aneurysm, or vascular malformation. MCAs: The middle  cerebral arteries are patent bilaterally. ACAs: The anterior cerebral arteries are patent bilaterally. POSTERIOR CIRCULATION: No significant stenosis, proximal occlusion, aneurysm, or vascular malformation. PCAs: The posterior cerebral arteries are patent bilaterally. Pcomm: Not well visualized. SCAs: The superior cerebellar arteries are patent bilaterally. Basilar artery: Patent AICAs: Patent PICAs: Patent on the left. There are diminutive branches of the right PICA noted. Venous sinuses: As permitted by contrast timing, patent. Anatomic variants: None Review of the MIP images confirms the above findings IMPRESSION: 1. Diminished caliber and opacification of the right vertebral artery from the origin to the distal V2 segment. 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 2. No acute intracranial hemorrhage. Electronically Signed   By: Kayla Strickland M.D.   On: 09/13/2023 16:32    Procedures .Critical Care  Performed by: Kayla Piper, PA-C Authorized by: Kayla Piper, PA-C   Critical care provider statement:    Critical care time (minutes):  41   Critical care was time spent personally by me on the following activities:  Development of treatment plan with patient or surrogate, discussions with consultants, evaluation of patient's response to treatment, examination of patient, ordering and review of laboratory studies, ordering and review of radiographic studies, ordering and performing treatments and interventions, pulse oximetry, re-evaluation of patient's condition and review of old charts   Care discussed with: admitting provider   Comments:     Vertebral artery dissection     Medications Ordered in ED Medications  sodium chloride 0.9 % bolus 1,000 mL (0 mLs Intravenous Stopped 09/13/23 1341)  ondansetron (ZOFRAN) injection 4 mg (4 mg Intravenous Given 09/13/23 1244)  iohexol  (OMNIPAQUE) 350 MG/ML injection 100 mL (100 mLs Intravenous Contrast Given 09/13/23 1343)  clopidogrel (PLAVIX) tablet 300 mg (300 mg Oral Given 09/13/23 1705)  aspirin EC tablet 650 mg (650 mg Oral Given 09/13/23 1705)    ED Course/ Medical Decision Making/ A&P Clinical Course as of 09/13/23 1845  Mon Sep 13, 2023  1256 Discussed case with neurology Dr. Wilford Strickland who recommends CTA head and neck, may end up needing MRI depending on CT results [AS]  1725 Discussed case with hospitalist Dr. Idelle Strickland who will admit, recommends vascular surgery consult [AS]  1733 Dr. Karin Strickland currently in surgery, will review imaging and call back after surgery. [AS]  1824 Dr. Karin Strickland recommends anticoagulation, he recommends consulting neurosurgery [AS]  1844 Discussed case with  neurosurgery who recommends anticoagulation, no surgical intervention [AS]    Clinical Course User Index [AS] Kayla Strickland, Kayla Petrin, PA-C                                 Medical Decision Making Amount and/or Complexity of Data Reviewed Labs: ordered. Radiology: ordered.  Risk OTC drugs. Prescription drug management. Decision regarding hospitalization.  This patient presents to the ED for concern of dizziness, this involves an extensive number of treatment options, and is a complaint that carries with it a high risk of complications and morbidity. The emergent differential diagnosis for acute vertigo low includes peripheral causes such as BPPV, barotrauma, ear foreign body, Mnire's disease, infectious causes such as lip bronchitis, vestibular neuritis or Ramsay Hunt syndrome.  Other emergent causes are central such as cerebellar stroke, vertebrobasilar insufficiency, neoplastic causes, vertebral artery dissection, MS, neurosyphilis or tuberculosis, epilepsy or migraine.  Other causes include anemia, hyperviscosity syndrome, alcohol or aminoglycoside use, renal failure, hypoglycemia and thyroid disease.   My initial workup includes labs,  imaging  Additional history obtained from: Nursing notes from this visit. Kayla Strickland at bedside  I ordered, reviewed and interpreted labs which include: CBC, BMP, respiratory panel, urinalysis, urine pregnancy  I ordered imaging studies including CTA head and neck I independently visualized and interpreted imaging which showed right-sided vertebral artery dissection I agree with the radiologist interpretation  Consultations Obtained:  I requested consultation with neurology Dr. Wilford Strickland,  and discussed lab and imaging findings as well as pertinent plan - they recommend: Admission, stroke workup, admit to hospitalist  Discussed case with hospitalist Dr. Idelle Strickland who requests vascular surgery consult  Discussed case with vascular surgery Dr. Karin Strickland who recommends anticoagulation, no surgical intervention but does recommend consulting with neurosurgery due to location.  Consult with neurosurgery, Kayla Durand NP with Dr. Jordan Strickland, who recommends anticoagulation, no surgical intervention  48 year old female presenting to the ED for evaluation of 3 days history of dizziness, sudden onset.  Symptoms are intermittent.  She states she was walking favoring her right side this morning.  She appears very well on physical exam.  No focal deficits appreciated.  Lab workup reassuring.  CTA head and neck concerning for right-sided vertebral artery dissection.  Consults as above.  Discussed plan with the patient who is in agreement.  Stable at time of admission.  Note: Portions of this report may have been transcribed using voice recognition software. Every effort was made to ensure accuracy; however, inadvertent computerized transcription errors may still be present.        Final Clinical Impression(s) / ED Diagnoses Final diagnoses:  Vertebral artery dissection [facility])  Dizziness    Rx / DC Orders ED Discharge Orders     None         Kayla Strickland, Kayla Strickland 09/13/23 1845    Tegeler, Canary Brim, MD 09/14/23 [phone]

## 2023-09-13 NOTE — Plan of Care (Signed)
 On-call neurology note  Called by EDP-3 to 4 days of feeling unwell, dizzy.  Also noticing soaring to 1 side. CTA pending.  No head imaging to review at this time  Recommend getting the CTA and follow-up on that.  May need MRI if everything including regular labs is unremarkable.  Please call back with questions as needed   Milon Dikes MD Neurology

## 2023-09-14 ENCOUNTER — Inpatient Hospital Stay (HOSPITAL_COMMUNITY)

## 2023-09-14 ENCOUNTER — Inpatient Hospital Stay (HOSPITAL_BASED_OUTPATIENT_CLINIC_OR_DEPARTMENT_OTHER)

## 2023-09-14 DIAGNOSIS — I6389 Other cerebral infarction: Secondary | ICD-10-CM

## 2023-09-14 DIAGNOSIS — R519 Headache, unspecified: Secondary | ICD-10-CM

## 2023-09-14 DIAGNOSIS — E785 Hyperlipidemia, unspecified: Secondary | ICD-10-CM | POA: Diagnosis not present

## 2023-09-14 DIAGNOSIS — I639 Cerebral infarction, unspecified: Secondary | ICD-10-CM | POA: Diagnosis not present

## 2023-09-14 DIAGNOSIS — I63211 Cerebral infarction due to unspecified occlusion or stenosis of right vertebral arteries: Secondary | ICD-10-CM | POA: Diagnosis not present

## 2023-09-14 DIAGNOSIS — R42 Dizziness and giddiness: Secondary | ICD-10-CM

## 2023-09-14 DIAGNOSIS — M542 Cervicalgia: Secondary | ICD-10-CM | POA: Diagnosis not present

## 2023-09-14 DIAGNOSIS — D649 Anemia, unspecified: Secondary | ICD-10-CM | POA: Insufficient documentation

## 2023-09-14 LAB — COMPREHENSIVE METABOLIC PANEL
ALT: 8 U/L (ref 0–44)
AST: 14 U/L — ABNORMAL LOW (ref 15–41)
Albumin: 3.2 g/dL — ABNORMAL LOW (ref 3.5–5.0)
Alkaline Phosphatase: 41 U/L (ref 38–126)
Anion gap: 7 (ref 5–15)
BUN: 11 mg/dL (ref 6–20)
CO2: 21 mmol/L — ABNORMAL LOW (ref 22–32)
Calcium: 8.7 mg/dL — ABNORMAL LOW (ref 8.9–10.3)
Chloride: 109 mmol/L (ref 98–111)
Creatinine, Ser: 0.7 mg/dL (ref 0.44–1.00)
GFR, Estimated: >60 mL/min (ref >60)
Glucose, Bld: 87 mg/dL (ref 70–99)
Potassium: 3.3 mmol/L — ABNORMAL LOW (ref 3.5–5.1)
Sodium: 137 mmol/L (ref 135–145)
Total Bilirubin: 0.4 mg/dL (ref 0.0–1.2)
Total Protein: 6.2 g/dL — ABNORMAL LOW (ref 6.5–8.1)

## 2023-09-14 LAB — VITAMIN B12: Vitamin B-12: 286 pg/mL (ref 180–914)

## 2023-09-14 LAB — HEMOGLOBIN A1C
Hgb A1c MFr Bld: 5.3 % (ref 4.8–5.6)
Mean Plasma Glucose: 105.41 mg/dL

## 2023-09-14 LAB — CBC
HCT: 27.9 % — ABNORMAL LOW (ref 36.0–46.0)
Hemoglobin: 7.9 g/dL — ABNORMAL LOW (ref 12.0–15.0)
MCH: 19.8 pg — ABNORMAL LOW (ref 26.0–34.0)
MCHC: 28.3 g/dL — ABNORMAL LOW (ref 30.0–36.0)
MCV: 69.8 fL — ABNORMAL LOW (ref 80.0–100.0)
Platelets: 338 10*3/uL (ref 150–400)
RBC: 4 MIL/uL (ref 3.87–5.11)
RDW: 18.2 % — ABNORMAL HIGH (ref 11.5–15.5)
WBC: 6.6 10*3/uL (ref 4.0–10.5)
nRBC: 0 % (ref 0.0–0.2)

## 2023-09-14 LAB — LIPID PANEL
Cholesterol: 176 mg/dL (ref 0–200)
HDL: 31 mg/dL — ABNORMAL LOW (ref >40)
LDL Cholesterol: 110 mg/dL — ABNORMAL HIGH (ref 0–99)
Total CHOL/HDL Ratio: 5.7 ratio
Triglycerides: 174 mg/dL — ABNORMAL HIGH (ref <150)
VLDL: 35 mg/dL (ref 0–40)

## 2023-09-14 LAB — ECHOCARDIOGRAM COMPLETE
Area-P 1/2: 3.74 cm2
Height: 64 in
S' Lateral: 2.7 cm
Weight: 2400 [oz_av]

## 2023-09-14 LAB — HIV ANTIBODY (ROUTINE TESTING W REFLEX): HIV Screen 4th Generation wRfx: NONREACTIVE

## 2023-09-14 LAB — IRON AND TIBC
Iron: 15 ug/dL — ABNORMAL LOW (ref 28–170)
Saturation Ratios: 3 % — ABNORMAL LOW (ref 10.4–31.8)
TIBC: 532 ug/dL — ABNORMAL HIGH (ref 250–450)
UIBC: 517 ug/dL

## 2023-09-14 LAB — RETICULOCYTES
Immature Retic Fract: 26.7 % — ABNORMAL HIGH (ref 2.3–15.9)
RBC.: 4.02 MIL/uL (ref 3.87–5.11)
Retic Count, Absolute: 77.6 10*3/uL (ref 19.0–186.0)
Retic Ct Pct: 1.9 % (ref 0.4–3.1)

## 2023-09-14 LAB — FERRITIN: Ferritin: 2 ng/mL — ABNORMAL LOW (ref 11–307)

## 2023-09-14 LAB — FOLATE: Folate: 15 ng/mL (ref >5.9)

## 2023-09-14 MED ORDER — BUTALBITAL-APAP-CAFFEINE 50-325-40 MG PO TABS
1.0000 | ORAL_TABLET | Freq: Four times a day (QID) | ORAL | Status: AC | PRN
  Administered 2023-09-14 – 2023-09-15 (×2): 1 via ORAL
  Filled 2023-09-14 (×2): qty 1

## 2023-09-14 MED ORDER — FERROUS SULFATE 325 (65 FE) MG PO TABS
325.0000 mg | ORAL_TABLET | Freq: Two times a day (BID) | ORAL | Status: DC
  Administered 2023-09-14 – 2023-09-16 (×4): 325 mg via ORAL
  Filled 2023-09-14 (×4): qty 1

## 2023-09-14 MED ORDER — ACETAMINOPHEN 650 MG RE SUPP: 650.0000 mg | RECTAL | Status: DC | PRN

## 2023-09-14 MED ORDER — CLOPIDOGREL BISULFATE 75 MG PO TABS
75.0000 mg | ORAL_TABLET | Freq: Every day | ORAL | Status: DC
  Administered 2023-09-14 – 2023-09-16 (×3): 75 mg via ORAL
  Filled 2023-09-14 (×3): qty 1

## 2023-09-14 MED ORDER — SODIUM CHLORIDE 0.9 % IV BOLUS
250.0000 mL | Freq: Once | INTRAVENOUS | Status: AC
  Administered 2023-09-14: 250 mL via INTRAVENOUS

## 2023-09-14 MED ORDER — ACETAMINOPHEN 325 MG PO TABS
650.0000 mg | ORAL_TABLET | ORAL | Status: DC | PRN
  Administered 2023-09-14 (×2): 650 mg via ORAL
  Filled 2023-09-14 (×2): qty 2

## 2023-09-14 MED ORDER — ENOXAPARIN SODIUM 40 MG/0.4ML IJ SOSY
40.0000 mg | PREFILLED_SYRINGE | INTRAMUSCULAR | Status: DC
  Administered 2023-09-14 – 2023-09-16 (×3): 40 mg via SUBCUTANEOUS
  Filled 2023-09-14 (×3): qty 0.4

## 2023-09-14 MED ORDER — ASPIRIN 81 MG PO TBEC
81.0000 mg | DELAYED_RELEASE_TABLET | Freq: Every day | ORAL | Status: DC
  Administered 2023-09-14 – 2023-09-16 (×3): 81 mg via ORAL
  Filled 2023-09-14 (×3): qty 1

## 2023-09-14 MED ORDER — ATORVASTATIN CALCIUM 40 MG PO TABS
40.0000 mg | ORAL_TABLET | Freq: Every day | ORAL | Status: DC
  Administered 2023-09-14 – 2023-09-16 (×3): 40 mg via ORAL
  Filled 2023-09-14 (×3): qty 1

## 2023-09-14 MED ORDER — STROKE: EARLY STAGES OF RECOVERY BOOK
Freq: Once | Status: AC
  Filled 2023-09-14: qty 1

## 2023-09-14 MED ORDER — POTASSIUM CHLORIDE CRYS ER 20 MEQ PO TBCR
40.0000 meq | EXTENDED_RELEASE_TABLET | Freq: Every day | ORAL | Status: DC
  Administered 2023-09-14 – 2023-09-16 (×3): 40 meq via ORAL
  Filled 2023-09-14 (×3): qty 2

## 2023-09-14 MED ORDER — SODIUM CHLORIDE 0.9 % IV SOLN: INTRAVENOUS | Status: AC

## 2023-09-14 MED ORDER — ACETAMINOPHEN 160 MG/5ML PO SOLN: 650.0000 mg | ORAL | Status: DC | PRN

## 2023-09-14 MED ORDER — OXYCODONE HCL 5 MG PO TABS
5.0000 mg | ORAL_TABLET | ORAL | Status: DC | PRN
  Administered 2023-09-14 – 2023-09-16 (×3): 5 mg via ORAL
  Filled 2023-09-14 (×3): qty 1

## 2023-09-14 NOTE — Evaluation (Signed)
 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  She was previously independent, working, caring for her family and driving.  Currently needing CGA for hallway mobility demonstrating veering and difficulty with dynamic gait with score on DGI 15/24.  She will benefit from skilled PT in the acute setting and possibly from follow up outpatient PT at d/c.       If plan is discharge home, recommend the following: A little help with walking and/or transfers;Help with stairs or ramp for entrance;Assist for transportation;Assistance with cooking/housework   Can travel by private vehicle        Equipment Recommendations None recommended by PT  Recommendations for Other Services       Functional Status Assessment Patient has had a recent decline in their functional status and demonstrates the ability to make significant improvements in function in a reasonable and predictable amount of time.     Precautions / Restrictions Precautions Precautions: Fall      Mobility  Bed Mobility Overal bed mobility: Modified Independent                  Transfers Overall transfer level: Modified independent                      Ambulation/Gait Ambulation/Gait assistance: Contact guard assist, Supervision Gait Distance (Feet): 200 Feet Assistive device: None Gait Pattern/deviations: Step-through pattern, Decreased stride length, Drifts right/left       General Gait Details: veering to L more than R and more with head turns, see DGI; CGA for balance/safety  Stairs Stairs:  Yes Stairs assistance: Supervision Stair Management: Alternating pattern, Two rails, Forwards Number of Stairs: 4 General stair comments: slow and using rails for stability.  Wheelchair Mobility     Tilt Bed    Modified Rankin (Stroke Patients Only) Modified Rankin (Stroke Patients Only) Pre-Morbid Rankin Score: No symptoms Modified Rankin: Moderately severe disability     Balance Overall balance assessment: Needs assistance   Sitting balance-Leahy Scale: Good       Standing balance-Leahy Scale: Good Standing balance comment: can stand eyes closed no UE support, no LOB x 10 seconds                 Standardized Balance Assessment Standardized Balance Assessment : Dynamic Gait Index   Dynamic Gait Index Level Surface: Mild Impairment Change in Gait Speed: Mild Impairment Gait with Horizontal Head Turns: Mild Impairment Gait with Vertical Head Turns: Mild Impairment Gait and Pivot Turn: Moderate Impairment Step Over Obstacle: Mild Impairment Step Around Obstacles: Mild Impairment Steps: Mild Impairment Total Score: 15       Pertinent Vitals/Pain Pain Assessment Pain Assessment: 0-10 Pain Score: 6  Pain Location: head (worse with mobility) Pain Descriptors / Indicators: Headache Pain Intervention(s): Monitored during session, Repositioned    Home Living Family/patient expects to be discharged to:: Private residence Living Arrangements: Spouse/significant other;Children Available Help at Discharge: Family Type of Home: House Home Access: Stairs to enter Entrance Stairs-Rails: Doctor, general practice of Steps: 7-8   Home Layout: Two level;1/2 bath on main level;Bed/bath upstairs Home Equipment: None Additional Comments: 2 dogs, 2 children    Prior Function  Prior Level of Function : Working/employed;Driving;Independent/Modified Independent                     Extremity/Trunk Assessment   Upper Extremity Assessment Upper  Extremity Assessment: Overall WFL for tasks assessed    Lower Extremity Assessment Lower Extremity Assessment: Overall WFL for tasks assessed    Cervical / Trunk Assessment Cervical / Trunk Assessment: Normal  Communication   Communication Communication: No apparent difficulties    Cognition Arousal: Alert Behavior During Therapy: WFL for tasks assessed/performed   PT - Cognitive impairments: No apparent impairments                         Following commands: Intact       Cueing       General Comments General comments (skin integrity, edema, etc.): VSS SBP 111 seated, 106 standing.  Completed vestibular screen noting normal smooth pursuits without nystagmus or c/o increased symptoms, normal saccades (vertical and horizontal), normal VOR though vertical movement increased symptoms, negative head thrust test and normal VOR cancellation.  Patient relates symptoms as lightheadedness more during mobility and today when sitting up to eat lunch.  Also has increased headache at times moving from more R frontoparietal location to R posterior parietal/occipital.    Exercises     Assessment/Plan    PT Assessment Patient needs continued PT services  PT Problem List Decreased mobility;Decreased activity tolerance;Decreased balance;Decreased safety awareness       PT Treatment Interventions DME instruction;Therapeutic exercise;Gait training;Balance training;Stair training;Functional mobility training;Therapeutic activities;Patient/family education;Other (comment) (vestibular rehab)    PT Goals (Current goals can be found in the Care Plan section)  Acute Rehab PT Goals Patient Stated Goal: return to independent PT Goal Formulation: With patient/family Time For Goal Achievement: 09/28/23 Potential to Achieve Goals: Good    Frequency Min 3X/week     Co-evaluation               AM-PAC PT "6 Clicks" Mobility  Outcome Measure Help needed turning from your back to  your side while in a flat bed without using bedrails?: None Help needed moving from lying on your back to sitting on the side of a flat bed without using bedrails?: None Help needed moving to and from a bed to a chair (including a wheelchair)?: None Help needed standing up from a chair using your arms (e.g., wheelchair or bedside chair)?: None Help needed to walk in hospital room?: A Little Help needed climbing 3-5 steps with a railing? : A Little 6 Click Score: 22    End of Session Equipment Utilized During Treatment: Gait belt Activity Tolerance: Patient limited by pain Patient left: in bed;with call bell/phone within reach;with nursing/sitter in room Nurse Communication: Other (comment) (increased headache with mobility) PT Visit Diagnosis: Other abnormalities of gait and mobility (R26.89);Dizziness and giddiness (R42)    Time: 1610-9604 PT Time Calculation (min) (ACUTE ONLY): 27 min   Charges:   PT Evaluation $PT Eval Moderate Complexity: 1 Mod PT Treatments $Gait Training: 8-22 mins PT General Charges $$ ACUTE PT VISIT: 1 Visit         Sheran Lawless, PT Acute Rehabilitation Services Office:[phone] 09/14/2023   Elray Mcgregor 09/14/2023, 1:03 PM

## 2023-09-14 NOTE — Progress Notes (Signed)
 TRH ROUNDING NOTE Kayla Strickland ION:[id number]  DOB: Jul 02, 1976  DOA: 09/13/2023  PCP: Pcp, No  09/14/2023,7:31 AM  LOS: 1 day    Code Status: Full   from: Home current Dispo: Likely home   48 year old female Previous C-sections complicated by endometriosis, otherwise sparse medical history 3/3 present [facility] acute vertigo right-sided headache pain since 09/11/2023 + headache 10/10 radiation right side of face jaw-unsteady gait noted with nausea vomiting came to ED CT angio head =?  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  Right vertebral dissection causing right-sided weakness  Neuro repeating CTA head tomorrow Loaded 650 ASA 300 Plavix continues aspirin 81 Plavix 75-LDL 110 total 176 triglycerides 174-continue high intensity statin atorvastatin 40 Await therapy eval's echocardiogram etc. 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-if blood pressures remain the same in the next 24 hours DC saline 40 cc/H  Endometriosis with DU B Microcytic iron deficiency anemia resulting from this No acute blood loss-iron levels low saturation levels very low-was not on iron in the outpatient setting so we will prescribe twice daily iron and likely will need saturation ratio checked in a month  Mild hypokalemia Give kdur 40, periodic labs  DVT prophylaxis: scd  Status is: Inpatient Remains inpatient appropriate because: Requires input and further workup   Subjective: No bleeding, Still sharp HA on R side, worsened with eating No fever chills Tells me bp usually is low at home  Objective + exam Vitals:   09/13/23 2140 09/14/23 0001 09/14/23 0332  09/14/23 0728  BP: 110/77 102/64 101/60 92/63  Pulse: 68 66 74 66  Resp: 18 16 12 12   Temp: 98.8 F (37.1 C) 98.2 F (36.8 C) 98.5 F (36.9 C) 98.4 F (36.9 C)  TempSrc: Oral Oral Oral Oral  SpO2: 100% 100% 99% 100%  Weight: 68 kg     Height: 5\' 4"  (1.626 m)      Filed Weights   09/13/23 1045 09/13/23 2140  Weight: 68 kg 68 kg    Examination: Eomi slight droop to R nasolabial fold Tongue midline. Finger now finger intact, vision by confrontation intact Cta b no wheze rales rhonchi S1 S2 no m/r/g Power 5/5, SLR bilat equal Reflexes 2/3   Data Reviewed: reviewed   CBC    Component Value Date/Time   WBC 6.6 09/14/2023 0558   RBC 4.00 09/14/2023 0558   RBC 4.02 09/14/2023 0558   HGB 7.9 (L) 09/14/2023 0558   HCT 27.9 (L) 09/14/2023 0558   PLT 338 09/14/2023 0558   MCV 69.8 (L) 09/14/2023 0558   MCH 19.8 (L) 09/14/2023 0558   MCHC 28.3 (L) 09/14/2023 0558   RDW 18.2 (H) 09/14/2023 0558   LYMPHSABS 1.4 01/31/2010 1507   MONOABS 0.5 01/31/2010 1507   EOSABS 0.1 01/31/2010 1507   BASOSABS 0.0 01/31/2010 1507      Latest Ref Rng & Units 09/14/2023    5:58 AM 09/13/2023   10:56 AM 08/23/2011    1:18 PM  CMP  Glucose 70 - 99 mg/dL 87  96    BUN 6 - 20 mg/dL 11  9    Creatinine 2.44 - 1.00 mg/dL 0.10  2.72  5.36   Sodium 135 - 145 mmol/L 137  138    Potassium 3.5 - 5.1 mmol/L 3.3  4.1    Chloride 98 - 111 mmol/L 109  109    CO2 22 - 32 mmol/L 21  21    Calcium 8.9 - 10.3 mg/dL 8.7  9.6    Total Protein 6.5 - 8.1 g/dL 6.2     Total Bilirubin 0.0 - 1.2 mg/dL 0.4     Alkaline Phos 38 - 126 U/L 41     AST 15 - 41 U/L 14     ALT 0 - 44 U/L 8       Scheduled Meds:  [START ON 09/15/2023]  stroke: early stages of recovery book   Does not apply Once   aspirin EC  81 mg Oral Daily   clopidogrel  75 mg Oral Daily   enoxaparin (LOVENOX) injection  40 mg Subcutaneous Q24H   Continuous Infusions:  sodium chloride 40 mL/hr at 09/14/23 0205    Time  34  Rhetta Mura, MD  [facility]

## 2023-09-14 NOTE — Evaluation (Signed)
 Occupational Therapy Evaluation Patient Details Name: DESHANTI ADCOX MRN: [id number] DOB: 25-Aug-1975 Today's Date: 09/14/2023   History of Present Illness   48 yo female admitted with acute R cerebellar infarct with possible R vertebral artery dissection, anemia microcytic hypochromic PMH endometriosis     Clinical Impressions Patient evaluated by Occupational Therapy with no further acute OT needs identified. All education has been completed and the patient has no further questions. See below for any follow-up Occupational Therapy or equipment needs. OT to sign off. Thank you for referral.       If plan is discharge home, recommend the following:         Functional Status Assessment   Patient has had a recent decline in their functional status and demonstrates the ability to make significant improvements in function in a reasonable and predictable amount of time.     Equipment Recommendations   None recommended by OT     Recommendations for Other Services         Precautions/Restrictions   Precautions Precautions: None Restrictions Weight Bearing Restrictions Per Provider Order: No     Mobility Bed Mobility Overal bed mobility: Modified Independent                  Transfers Overall transfer level: Modified independent                        Balance                                           ADL either performed or assessed with clinical judgement   ADL Overall ADL's : Modified independent                                       General ADL Comments: completed bed mobility don of boots doff of boots sink level grooming, bathroom transfer with hygiene , change of directions and 3 step commands.     Vision Baseline Vision/History: 0 No visual deficits Ability to See in Adequate Light: 0 Adequate Patient Visual Report: No change from baseline       Perception         Praxis          Pertinent Vitals/Pain Pain Assessment Pain Assessment: 0-10 Pain Score: 8  Pain Location: head Pain Descriptors / Indicators: Headache Pain Intervention(s): Monitored during session, Limited activity within patient's tolerance     Extremity/Trunk Assessment Upper Extremity Assessment Upper Extremity Assessment: Right hand dominant;[facility] for tasks assessed   Lower Extremity Assessment Lower Extremity Assessment: Overall WFL for tasks assessed   Cervical / Trunk Assessment Cervical / Trunk Assessment: Normal   Communication Communication Communication: No apparent difficulties   Cognition Arousal: Alert Behavior During Therapy: WFL for tasks assessed/performed Cognition: No apparent impairments                               Following commands: Intact       Cueing  General Comments      VSS on RA   Exercises     Shoulder Instructions      Home Living Family/patient expects to be discharged to:: Private residence Living Arrangements: Spouse/significant other Available Help at  Discharge: Available PRN/intermittently Type of Home: House Home Access: Stairs to enter [employer] of Steps: 7-8 Entrance Stairs-Rails: Right;Left Home Layout: Two level;1/2 bath on main level;Bed/bath upstairs     Bathroom Shower/Tub: Producer, television/film/video: Standard     Home Equipment: None   Additional Comments: 2 dogs, 2 children      Prior Functioning/Environment Prior Level of Function : Working/employed;Driving;Independent/Modified Independent (work on Hospital doctor)                    OT Problem List:     OT Treatment/Interventions:        OT Goals(Current goals can be found in the care plan section)   Acute Rehab OT Goals Patient Stated Goal: to go home   OT Frequency:       Co-evaluation              AM-PAC OT "6 Clicks" Daily Activity     Outcome Measure Help from another person eating meals?:  None Help from another person taking care of personal grooming?: None Help from another person toileting, which includes using toliet, bedpan, or urinal?: None Help from another person bathing (including washing, rinsing, drying)?: None Help from another person to put on and taking off regular upper body clothing?: None Help from another person to put on and taking off regular lower body clothing?: None 6 Click Score: 24   End of Session Nurse Communication: Mobility status;Precautions  Activity Tolerance: Patient tolerated treatment well Patient left: in bed;with call bell/phone within reach;with family/visitor present  OT Visit Diagnosis: Unsteadiness on feet (R26.81)                Time: 9604-5409 OT Time Calculation (min): 19 min Charges:  OT General Charges $OT Visit: 1 Visit OT Evaluation $OT Eval Low Complexity: 1 Low   Brynn, OTR/L  Acute Rehabilitation Services Office: [phone] .   Mateo Flow 09/14/2023, 8:43 AM

## 2023-09-14 NOTE — Progress Notes (Signed)
   09/14/23 5784  Spiritual Encounters  Type of Visit Initial  Care provided to: Pt and family  Reason for visit Advance directives  OnCall Visit No   Chaplain completed AD education with Patient this morning and gave Patient AD documentation.  Chaplain Daron Offer

## 2023-09-14 NOTE — Progress Notes (Addendum)
 STROKE TEAM PROGRESS NOTE   INTERIM HISTORY/SUBJECTIVE Repeat CT tomorrow to assess cerebellar stroke.  She is still reporting dizziness with walking and head shaking as well as a severe headache.  Denies any head or neck trauma, abrupt neck movement.  OBJECTIVE  CBC    Component Value Date/Time   WBC 6.6 09/14/2023 0558   RBC 4.00 09/14/2023 0558   RBC 4.02 09/14/2023 0558   HGB 7.9 (L) 09/14/2023 0558   HCT 27.9 (L) 09/14/2023 0558   PLT 338 09/14/2023 0558   MCV 69.8 (L) 09/14/2023 0558   MCH 19.8 (L) 09/14/2023 0558   MCHC 28.3 (L) 09/14/2023 0558   RDW 18.2 (H) 09/14/2023 0558   LYMPHSABS 1.4 01/31/2010 1507   MONOABS 0.5 01/31/2010 1507   EOSABS 0.1 01/31/2010 1507   BASOSABS 0.0 01/31/2010 1507    BMET    Component Value Date/Time   NA 137 09/14/2023 0558   K 3.3 (L) 09/14/2023 0558   CL 109 09/14/2023 0558   CO2 21 (L) 09/14/2023 0558   GLUCOSE 87 09/14/2023 0558   BUN 11 09/14/2023 0558   CREATININE 0.70 09/14/2023 0558   CALCIUM 8.7 (L) 09/14/2023 0558   GFRNONAA >60 09/14/2023 0558    IMAGING past 24 hours MR BRAIN WO CONTRAST Result Date: 09/14/2023 CLINICAL DATA:  Neuro deficit, acute, stroke suspected EXAM: MRI HEAD WITHOUT CONTRAST TECHNIQUE: Multiplanar, multiecho pulse sequences of the brain and surrounding structures were obtained without intravenous contrast. COMPARISON:  None Available. FINDINGS: Significant artifact from the patient's braces limits assessment and obscures portions of the anterior frontal lobes, face/orbits, and posterior fossa. Diffusion-weighted and susceptibility weighted imaging are particularly affected. Within this limitation: Brain: Small area of edema or encephalomalacia in the inferior right cerebellum not be evaluated on DWI imaging due to artifact. Otherwise, no visible acute infarction, acute hemorrhage, hydrocephalus, extra-axial collection or mass lesion. Vascular: Absent right vertebral arterial flow void, compatible with  findings on same day CTA. Skull and upper cervical spine: Normal marrow signal. Sinuses/Orbits: Obscured. Other: No mastoid effusions. IMPRESSION: 1. Small area of edema or encephalomalacia in the inferior right cerebellum. Findings are concerning for an acute infarct given findings on same day CTA and reported symptoms; however, unfortunately this cannot be confirmed given this area is obscured on diffusion-weighted imaging by artifact from the patient's braces. 2. Otherwise, no visible acute abnormality on this limited MRI. These results will be called to the ordering clinician or representative by the Radiologist Assistant, and communication documented in the PACS or [employer]. Electronically Signed   By: Feliberto Harts M.D.   On: 09/14/2023 01:17   CT Angio Head Neck W WO CM Result Date: 09/13/2023 CLINICAL DATA:  Central vertigo, dizziness for several days. EXAM: CT ANGIOGRAPHY HEAD AND NECK WITH AND WITHOUT CONTRAST TECHNIQUE: Multidetector CT imaging of the head and neck was performed using the standard protocol during bolus administration of intravenous contrast. Multiplanar CT image reconstructions and MIPs were obtained to evaluate the vascular anatomy. Carotid stenosis measurements (when applicable) are obtained utilizing NASCET criteria, using the distal internal carotid diameter as the denominator. RADIATION DOSE REDUCTION: This exam was performed according to the departmental dose-optimization program which includes automated exposure control, adjustment of the mA and/or kV according to patient size and/or use of iterative reconstruction technique. CONTRAST:  OMNIPAQUE IOHEXOL 350 MG/ML SOLN COMPARISON:  CT head and CTA neck 02/16/2009 FINDINGS: CT HEAD FINDINGS Brain: No acute intracranial hemorrhage. No CT evidence of acute infarct. No edema, mass  effect, or midline shift. The basilar cisterns are patent. Ventricles: Ventricles are normal in size and configuration. Vascular: No  hyperdense vessel. Skull: No acute or aggressive finding. Sinuses/orbits: The orbits are symmetric. Minimal secretions in the right sphenoid sinus. Other: Mastoid air cells are clear. CTA NECK FINDINGS Aortic arch: Standard configuration of the aortic arch. Imaged portion shows no evidence of aneurysm or dissection. No significant stenosis of the major arch vessel origins. Pulmonary arteries: As permitted by contrast timing, there are no filling defects in the visualized pulmonary arteries. Subclavian arteries: The subclavian arteries are patent bilaterally. Right carotid system: No evidence of dissection, stenosis (50% or greater), or occlusion. Left carotid system: No evidence of dissection, stenosis (50% or greater), or occlusion. Vertebral arteries: The left vertebral artery is patent from the origin to the vertebrobasilar confluence. There is diminished caliber and diminished contrast within the right vertebral artery from the origin to the distal V2 segment. There is significantly diminished caliber and ventral occlusion of the right vertebral artery at the proximal V3 segment just distal to the C2 transverse foramen. The right vertebral artery is occluded from the proximal V3 segment to the distal V4 segment. Skeleton: No acute or aggressive finding noted. Other neck: The visualized airway is patent. No cervical lymphadenopathy. Mildly heterogeneous appearance of the thyroid with multiple subcentimeter nodules noted. Upper chest: Visualized lung apices are clear. Review of the MIP images confirms the above findings CTA HEAD FINDINGS ANTERIOR CIRCULATION: The intracranial ICAs are patent bilaterally. No significant stenosis, proximal occlusion, aneurysm, or vascular malformation. MCAs: The middle cerebral arteries are patent bilaterally. ACAs: The anterior cerebral arteries are patent bilaterally. POSTERIOR CIRCULATION: No significant stenosis, proximal occlusion, aneurysm, or vascular malformation. PCAs: The  posterior cerebral arteries are patent bilaterally. Pcomm: Not well visualized. SCAs: The superior cerebellar arteries are patent bilaterally. Basilar artery: Patent AICAs: Patent PICAs: Patent on the left. There are diminutive branches of the right PICA noted. Venous sinuses: As permitted by contrast timing, patent. Anatomic variants: None Review of the MIP images confirms the above findings IMPRESSION: 1. Diminished caliber and opacification of the right vertebral artery from the origin to the distal V2 segment. 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 2. No acute intracranial hemorrhage. Electronically Signed   By: Emily Filbert M.D.   On: 09/13/2023 16:32    Vitals:   09/13/23 2140 09/14/23 0001 09/14/23 0332 09/14/23 0728  BP: 110/77 102/64 101/60 92/63  Pulse: 68 66 74 66  Resp: 18 16 12 16   Temp: 98.8 F (37.1 C) 98.2 F (36.8 C) 98.5 F (36.9 C) 98.4 F (36.9 C)  TempSrc: Oral Oral Oral Oral  SpO2: 100% 100% 99% 100%  Weight: 68 kg     Height: 5\' 4"  (1.626 m)        PHYSICAL EXAM General:  Alert, well-nourished, well-developed patient in no acute distress Psych:  Mood and affect appropriate for situation CV: Regular rate and rhythm on monitor Respiratory:  Regular, unlabored respirations on room air GI: Abdomen soft and nontender   NEURO:  Mental Status: AA&Ox3, patient is able to give clear and coherent history Speech/Language: speech is without dysarthria or aphasia.  Naming, repetition, fluency, and comprehension intact.  Cranial Nerves:  II: PERRL. Visual fields full.  III, IV, VI: EOMI. Eyelids elevate symmetrically.  V: Sensation is intact to light touch and symmetrical to face.  VII: Face is symmetrical resting  and smiling VIII: hearing intact to voice. IX, X: Palate elevates symmetrically. Phonation is normal.  ZO:[id number] shrug 5/5. XII: tongue is midline  without fasciculations. Motor: 5/5 strength to all muscle groups tested.  Tone: is normal and bulk is normal Sensation- Intact to light touch bilaterally. Extinction absent to light touch to DSS.   Coordination: FTN intact bilaterally, HKS: no ataxia in BLE.No drift.  Gait- deferred Endorses dizziness with walking Most Recent NIH 0     ASSESSMENT/PLAN  Ms. Kayla Strickland is a 48 y.o. female with history of endometriosis who presented initially to [facility] for assessment of acute onset vertigo evolving to right sided headache with right neck pain, N/V and listing to the right.  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 MRI  Small area of edema or encephalomalacia in the inferior right cerebellum. CT repeat in a.m. 2D Echo EF 60-65% TCD w bubble study  pending  LDL 110 HgbA1c 5.3 UDS negative VTE prophylaxis - lovenox No antithrombotic prior to admission, now on aspirin 81 mg daily and clopidogrel 75 mg daily for 3 months and then ASA 81mg  alone. Repeat CTA head neck in 2 months as outpatient Therapy recommendations:  Outpatient PT/OT/ST Disposition:  Pending  Headache with neck pain Oxycodone ordered by primary team Will additionally order 2 days of fiorcet  BP measurement Stable on the low end Avoid low BP Long-term BP goal normotensive  Hyperlipidemia Home meds none LDL 110, goal < 70 Add atorvastatin 40mg  Continue statin at discharge  Other Active Problems Endometriosis  Macrocytic anemia, may be related to endometriosis, hemoglobin 9.0--7.9, low iron and ferritin level, on iron pills  Hospital day # 1  Marvel Plan, MD PhD Stroke  Neurology 09/14/2023 7:15 PM  I had long discussion with patient and husband at bedside, updated pt current condition, treatment plan and potential prognosis, and answered all the questions.  They expressed understanding and appreciation.     To contact Stroke Continuity provider, please refer to [email or web address]. After hours, contact General Neurology

## 2023-09-14 NOTE — TOC Initial Note (Addendum)
 Transition of Care [facility]) - Initial/Assessment Note    Patient Details  Name: Kayla Strickland MRN: [id number] Date of Birth: 04-01-1976  Transition of [facility]) CM/SW Contact:    Kermit Balo, RN Phone Number: 09/14/2023, 4:15 PM  Clinical Narrative:                  Pt is from home with her spouse. He works but they have neighbors that can check on her if needed.  No DME at home.  She denies issues with home medications. Family is able to provide needed transportation. Outpatient PT arranged through [facility] per pt request. Information on the AVS. Pt will call to schedule the first appointment. No PCP: CM was able to get her an appointment and placed it on AVS.  TOC following.  Expected Discharge Plan: OP Rehab Barriers to Discharge: Continued Medical Work up   Patient Goals and CMS Choice     Choice offered to / list presented to : Patient      Expected Discharge Plan and Services   Discharge Planning Services: CM Consult   Living arrangements for the past 2 months: Single Family Home                                      Prior Living Arrangements/Services Living arrangements for the past 2 months: Single Family Home Lives with:: Spouse Patient language and need for interpreter reviewed:: Yes Do you feel safe going back to the place where you live?: Yes        Care giver support system in place?: Yes (comment)   Criminal Activity/Legal Involvement Pertinent to Current Situation/Hospitalization: No - Comment as needed  Activities of Daily Living   ADL Screening (condition at time of admission) Independently performs ADLs?: Yes (appropriate for developmental age) Is the patient deaf or have difficulty hearing?: No Does the patient have difficulty seeing, even when wearing glasses/contacts?: No Does the patient have difficulty concentrating, remembering, or making decisions?: No  Permission Sought/Granted                  Emotional  Assessment Appearance:: Appears stated age Attitude/Demeanor/Rapport: Engaged Affect (typically observed): Accepting Orientation: : Oriented to Self, Oriented to Place, Oriented to  Time, Oriented to Situation   Psych Involvement: No (comment)  Admission diagnosis:  Dizziness [R42] Vertebral artery dissection (HCC) [I77.74] Patient Active Problem List   Diagnosis Date Noted   Dizziness 09/14/2023   Anemia 09/14/2023   Acute CVA (cerebrovascular accident) (HCC) 09/14/2023   Vertebral artery dissection (HCC) 09/13/2023   Cesarean delivery delivered 08/23/2011   Chorioamnionitis, delivered, current hospitalization 08/23/2011   Post-term pregnancy, 40-42 weeks of gestation 08/22/2011   PCP:  Oneita Hurt No Pharmacy:   [id number] [place], [place] - [address] AT [facility] OF [facility] & [address] [place] [address] Phone: [phone] Fax: [phone]  [id number] - [place], [place] - [address] AT CORNER OF [facility] [address] [place] [address] Phone: [phone] Fax: [phone]  [facility] [address] [place] [address] Phone: [phone] Fax: [phone]     Social Drivers of Health (SDOH) Social History: SDOH Screenings   Food Insecurity: No Food Insecurity (09/13/2023)  Housing: Low Risk  (09/13/2023)  Transportation Needs: No Transportation Needs (09/13/2023)  Utilities: Not At  Risk (09/13/2023)  Tobacco Use: Low Risk  (09/13/2023)   SDOH Interventions:     Readmission Risk Interventions     No data to display

## 2023-09-15 ENCOUNTER — Observation Stay (HOSPITAL_COMMUNITY)

## 2023-09-15 DIAGNOSIS — D649 Anemia, unspecified: Secondary | ICD-10-CM | POA: Diagnosis not present

## 2023-09-15 DIAGNOSIS — E785 Hyperlipidemia, unspecified: Secondary | ICD-10-CM | POA: Diagnosis not present

## 2023-09-15 DIAGNOSIS — R42 Dizziness and giddiness: Secondary | ICD-10-CM | POA: Diagnosis not present

## 2023-09-15 DIAGNOSIS — R519 Headache, unspecified: Secondary | ICD-10-CM | POA: Diagnosis not present

## 2023-09-15 DIAGNOSIS — I7774 Dissection of vertebral artery: Secondary | ICD-10-CM | POA: Diagnosis not present

## 2023-09-15 DIAGNOSIS — I639 Cerebral infarction, unspecified: Secondary | ICD-10-CM | POA: Diagnosis not present

## 2023-09-15 DIAGNOSIS — D539 Nutritional anemia, unspecified: Secondary | ICD-10-CM

## 2023-09-15 DIAGNOSIS — M542 Cervicalgia: Secondary | ICD-10-CM | POA: Diagnosis not present

## 2023-09-15 DIAGNOSIS — I63211 Cerebral infarction due to unspecified occlusion or stenosis of right vertebral arteries: Secondary | ICD-10-CM | POA: Diagnosis not present

## 2023-09-15 LAB — RENAL FUNCTION PANEL
Albumin: 3.4 g/dL — ABNORMAL LOW (ref 3.5–5.0)
Anion gap: 3 — ABNORMAL LOW (ref 5–15)
BUN: 6 mg/dL (ref 6–20)
CO2: 22 mmol/L (ref 22–32)
Calcium: 8.6 mg/dL — ABNORMAL LOW (ref 8.9–10.3)
Chloride: 110 mmol/L (ref 98–111)
Creatinine, Ser: 0.68 mg/dL (ref 0.44–1.00)
GFR, Estimated: >60 mL/min (ref >60)
Glucose, Bld: 87 mg/dL (ref 70–99)
Phosphorus: 3.7 mg/dL (ref 2.5–4.6)
Potassium: 3.7 mmol/L (ref 3.5–5.1)
Sodium: 135 mmol/L (ref 135–145)

## 2023-09-15 LAB — CBC
HCT: 30.3 % — ABNORMAL LOW (ref 36.0–46.0)
Hemoglobin: 8.2 g/dL — ABNORMAL LOW (ref 12.0–15.0)
MCH: 19 pg — ABNORMAL LOW (ref 26.0–34.0)
MCHC: 27.1 g/dL — ABNORMAL LOW (ref 30.0–36.0)
MCV: 70.3 fL — ABNORMAL LOW (ref 80.0–100.0)
Platelets: 395 10*3/uL (ref 150–400)
RBC: 4.31 MIL/uL (ref 3.87–5.11)
RDW: 18.1 % — ABNORMAL HIGH (ref 11.5–15.5)
WBC: 5.2 10*3/uL (ref 4.0–10.5)
nRBC: 0 % (ref 0.0–0.2)

## 2023-09-15 LAB — MAGNESIUM: Magnesium: 2.2 mg/dL (ref 1.7–2.4)

## 2023-09-15 MED ORDER — IRON SUCROSE 300 MG IVPB - SIMPLE MED
300.0000 mg | Freq: Once | Status: DC
  Filled 2023-09-15: qty 265

## 2023-09-15 MED ORDER — SODIUM CHLORIDE 0.9 % IV SOLN
300.0000 mg | Freq: Once | INTRAVENOUS | Status: AC
  Administered 2023-09-15: 300 mg via INTRAVENOUS
  Filled 2023-09-15: qty 15

## 2023-09-15 NOTE — Progress Notes (Signed)
 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  Scored 22/24 today on DGI and reports feeling less like she is "drunk".  Patient educated on stroke related fatigue and options for coping with memory issues.  Family present and supportive.  Continue to recommend outpatient PT at d/c.     If plan is discharge home, recommend the following: A little help with walking and/or transfers;Help with stairs or ramp for entrance;Assist for transportation;Assistance with cooking/housework   Can travel by private vehicle        Equipment Recommendations  None recommended by PT    Recommendations for Other Services       Precautions / Restrictions Precautions Precautions: Fall     Mobility  Bed Mobility Overal bed mobility: Modified Independent                  Transfers Overall transfer level: Modified independent Equipment used: None                    Ambulation/Gait Ambulation/Gait assistance: Supervision Gait Distance (Feet): 220 Feet Assistive device: None Gait Pattern/deviations: Step-through pattern       General Gait Details: completed DGI without LOB   Stairs Stairs: Yes Stairs assistance: Modified independent (Device/Increase time) Stair Management: Alternating pattern, Two rails, Forwards Number of Stairs: 4     Wheelchair Mobility     Tilt Bed    Modified Rankin (Stroke Patients Only) Modified Rankin (Stroke Patients Only) Pre-Morbid Rankin Score: No symptoms Modified Rankin: Moderate disability     Balance     Sitting balance-Leahy Scale: Good        Standing balance-Leahy Scale: Good                   Standardized Balance Assessment Standardized Balance Assessment : Dynamic Gait Index   Dynamic Gait Index Level Surface: Normal Change in Gait Speed: Normal Gait with Horizontal Head Turns: Normal Gait with Vertical Head Turns: Normal Gait and Pivot Turn: Mild Impairment Step Over Obstacle: Normal Step Around Obstacles: Normal Steps: Mild Impairment Total Score: 22      Communication Communication Communication: No apparent difficulties  Cognition Arousal: Alert Behavior During Therapy: WFL for tasks assessed/performed   PT - Cognitive impairments: Memory                       PT - Cognition Comments: admits to frustration today trying to remember and speak with her mother about something Following commands: Intact      Cueing    Exercises      General Comments General comments (skin integrity, edema, etc.): Education with pt and family on stroke related fatigue and importance of planning ahead and taking rest breaks.  Also using memory strategies to help cope with any issues and reduce frustration.      Pertinent Vitals/Pain Pain Assessment Pain Assessment: No/denies pain    Home Living                          Prior Function            PT  Goals (current goals can now be found in the care plan section) Progress towards PT goals: Progressing toward goals    Frequency    Min 3X/week      PT Plan      Co-evaluation              AM-PAC PT "6 Clicks" Mobility   Outcome Measure  Help needed turning from your back to your side while in a flat bed without using bedrails?: None Help needed moving from lying on your back to sitting on the side of a flat bed without using bedrails?: None Help needed moving to and from a bed to a chair (including a wheelchair)?: None Help needed standing up from a chair using your arms (e.g., wheelchair or bedside chair)?: None Help  needed to walk in hospital room?: None Help needed climbing 3-5 steps with a railing? : A Little 6 Click Score: 23    End of Session Equipment Utilized During Treatment: Gait belt Activity Tolerance: Patient tolerated treatment well Patient left: in bed;with call bell/phone within reach;with family/visitor present   PT Visit Diagnosis: Other abnormalities of gait and mobility (R26.89);Dizziness and giddiness (R42)     Time: 1610-9604 PT Time Calculation (min) (ACUTE ONLY): 24 min  Charges:    $Gait Training: 8-22 mins $Neuromuscular Re-education: 8-22 mins PT General Charges $$ ACUTE PT VISIT: 1 Visit                     Sheran Lawless, PT Acute Rehabilitation Services [phone] 09/15/2023    Elray Mcgregor 09/15/2023, 5:11 PM

## 2023-09-15 NOTE — Plan of Care (Signed)
  Problem: Education: Goal: Knowledge of General Education information will improve Description: Including pain rating scale, medication(s)/side effects and non-pharmacologic comfort measures Outcome: Progressing   Problem: Clinical Measurements: Goal: Ability to maintain clinical measurements within normal limits will improve Outcome: Progressing Goal: Diagnostic test results will improve Outcome: Progressing Goal: Respiratory complications will improve Outcome: Progressing Goal: Cardiovascular complication will be avoided Outcome: Progressing   Problem: Activity: Goal: Risk for activity intolerance will decrease Outcome: Progressing   Problem: Nutrition: Goal: Adequate nutrition will be maintained Outcome: Progressing   Problem: Elimination: Goal: Will not experience complications related to bowel motility Outcome: Progressing Goal: Will not experience complications related to urinary retention Outcome: Progressing   Problem: Education: Goal: Knowledge of disease or condition will improve Outcome: Progressing   Problem: Ischemic Stroke/TIA Tissue Perfusion: Goal: Complications of ischemic stroke/TIA will be minimized Outcome: Progressing   Problem: Self-Care: Goal: Ability to communicate needs accurately will improve Outcome: Progressing   Problem: Nutrition: Goal: Risk of aspiration will decrease Outcome: Progressing

## 2023-09-15 NOTE — Progress Notes (Signed)
 PROGRESS NOTE  Kayla Strickland:[id number] DOB: 07/08/1976   PCP: Pcp, No  Patient is from: Home.  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  Neurology consulted.  Patient was started on aspirin and Plavix.  TTE without significant finding.  A1c was 5.3%.  LDL 110.  Repeat CT head on 3/5 with expected evolution of right cerebral CVA but no other acute finding.  Neurology planning to perform TCD to exclude shunting.  Anemia panel with severe iron deficiency.  Infusing IV iron.  Subjective: Seen and examined earlier this morning.  No major events overnight of this morning.  Reports improvement in headache.  Currently right-sided headache 3/10 on the right side.  Denies dizziness, nausea or vomiting.  Objective: Vitals:   09/14/23 2327 09/15/23 0328 09/15/23 0730 09/15/23 1053  BP: 110/81 101/63 (!) 99/59 102/68  Pulse: 76 71 71 73  Resp: 17 17 19 17   Temp: 98.2 F (36.8 C) 98.3 F (36.8 C) 98.7 F (37.1 C) 98.5 F (36.9 C)  TempSrc: Oral Oral Oral Oral  SpO2: 98% 100% 93% 100%  Weight:      Height:        Examination:  GENERAL: No apparent distress.  Nontoxic. HEENT: MMM.  Vision and hearing grossly intact.  NECK: Supple.  No apparent JVD.  RESP:  No IWOB.  Fair aeration bilaterally. CVS:  RRR. Heart sounds normal.  ABD/GI/GU: BS+. Abd soft, NTND.  MSK/EXT:  Moves extremities. No apparent deformity. No edema.  SKIN: no apparent skin lesion or wound NEURO: Awake, alert and oriented appropriately.  Very subtle right-sided weakness.  Finger-to-nose intact.   No nystagmus. PSYCH: Calm. Normal affect.   Procedures:  None  Microbiology summarized: COVID-19, influenza and RSV PCR nonreactive  Assessment and plan: Acute right cerebellar and vermis infarct-noted on MRI. Right vertebral artery dissection-CT angio head and neck with diminished caliber and opacification of right vertebral artery from the origin to distal V2 segment, occlusion of right VA from proximal V3 segment to distal V4 segment concerning for vertebral dissection.  Repeat CT head with expected evolution of right cerebral CVA.  TTE without significant finding.  UDS negative.  A1c 5.3%.  LDL 110. -Neurology recommended aspirin and Plavix for 3 months followed by aspirin alone -Repeat CT angio head and neck in 2 months per outpatient -TCD with bubble study pending -Avoid hypotension. -Disposition likely home  Right-sided headache, vertigo, nausea and vomiting: Likely due to the above.  Resolving. -Management as above -Pain control.   Hyperlipidemia: LDL 110.  Goal less than 70. -Lipitor 40 mg daily  Iron deficiency anemia: Likely due to abnormal uterine bleed. Abnormal uterine bleed due to endometriosis Recent Labs    09/13/23 1056 09/14/23 0558 09/15/23 0849  HGB 9.0* 7.9* 8.2*  -Anemia panel with severe iron deficiency. -Will give IV Venofer 300 mg x 1 followed by p.o. iron. -Continue monitoring  Hypokalemia -Monitor replenish as appropriate  Body mass index is 25.75 kg/m.          DVT prophylaxis:  enoxaparin (LOVENOX) injection 40 mg Start: 09/14/23 1000  Code Status: Full  code Family Communication: None at bedside Level of care: Telemetry Medical Status is: Observation The patient will require care spanning > 2 midnights and should be moved to inpatient because: Acute cerebellar CVA   Final disposition: Home Consultants:  Neurology  55 minutes with more than 50% spent in reviewing records, counseling patient/family and coordinating  care.   Sch Meds:  Scheduled Meds:  aspirin EC  81 mg Oral Daily   atorvastatin  40 mg Oral Daily   clopidogrel  75 mg Oral Daily   enoxaparin (LOVENOX) injection  40 mg Subcutaneous Q24H   ferrous sulfate  325 mg Oral BID WC   potassium chloride  40 mEq Oral Daily   Continuous Infusions:  iron sucrose     PRN Meds:.acetaminophen **OR** acetaminophen (TYLENOL) oral liquid 160 mg/5 mL **OR** acetaminophen, butalbital-acetaminophen-caffeine, oxyCODONE  Antimicrobials: Anti-infectives (From admission, onward)    None        I have personally reviewed the following labs and images: CBC: Recent Labs  Lab 09/13/23 1056 09/14/23 0558 09/15/23 0849  WBC 7.0 6.6 5.2  HGB 9.0* 7.9* 8.2*  HCT 31.6* 27.9* 30.3*  MCV 70.1* 69.8* 70.3*  PLT 405* 338 395   BMP &GFR Recent Labs  Lab 09/13/23 1056 09/14/23 0558 09/15/23 0849  NA 138 137 135  K 4.1 3.3* 3.7  CL 109 109 110  CO2 21* 21* 22  GLUCOSE 96 87 87  BUN 9 11 6   CREATININE 0.61 0.70 0.68  CALCIUM 9.6 8.7* 8.6*  MG  --   --  2.2  PHOS  --   --  3.7   Estimated Creatinine Clearance: 84.1 mL/min (by C-G formula based on SCr of 0.68 mg/dL). Liver & Pancreas: Recent Labs  Lab 09/14/23 0558 09/15/23 0849  AST 14*  --   ALT 8  --   ALKPHOS 41  --   BILITOT 0.4  --   PROT 6.2*  --   ALBUMIN 3.2* 3.4*   No results for input(s): "LIPASE", "AMYLASE" in the last 168 hours. No results for input(s): "AMMONIA" in the last 168 hours. Diabetic: Recent Labs    09/14/23 0558  HGBA1C 5.3   Recent Labs  Lab 09/13/23 1229  GLUCAP 66*   Cardiac Enzymes: No results for input(s): "CKTOTAL", "CKMB", "CKMBINDEX", "TROPONINI" in the last 168 hours. No results for input(s): "PROBNP" in the last 8760 hours. Coagulation Profile: Recent Labs  Lab 09/13/23 1653  INR 1.0   Thyroid Function Tests: No results for input(s): "TSH", "T4TOTAL", "FREET4", "T3FREE", "THYROIDAB" in the last 72 hours. Lipid Profile: Recent Labs     09/14/23 0558  CHOL 176  HDL 31*  LDLCALC 110*  TRIG 174*  CHOLHDL 5.7   Anemia Panel: Recent Labs    09/14/23 0558  VITAMINB12 286  FOLATE 15.0  FERRITIN 2*  TIBC 532*  IRON 15*  RETICCTPCT 1.9   Urine analysis:    Component Value Date/Time   COLORURINE YELLOW 09/13/2023 1056   APPEARANCEUR HAZY (A) 09/13/2023 1056   LABSPEC 1.007 09/13/2023 1056   PHURINE 6.0 09/13/2023 1056   GLUCOSEU NEGATIVE 09/13/2023 1056   HGBUR NEGATIVE 09/13/2023 1056   BILIRUBINUR NEGATIVE 09/13/2023 1056   KETONESUR NEGATIVE 09/13/2023 1056   PROTEINUR NEGATIVE 09/13/2023 1056   UROBILINOGEN 0.2 01/31/2010 1411   NITRITE NEGATIVE 09/13/2023 1056   LEUKOCYTESUR NEGATIVE 09/13/2023 1056   Sepsis Labs: Invalid input(s): "PROCALCITONIN", "LACTICIDVEN"  Microbiology: Recent Results (from the past 240 hours)  Resp panel by RT-PCR (RSV,  Flu A&B, Covid) Anterior Nasal Swab     Status: None   Collection Time: 09/13/23 10:50 AM   Specimen: Anterior Nasal Swab  Result Value Ref Range Status   SARS Coronavirus 2 by RT PCR NEGATIVE NEGATIVE Final    Comment: (NOTE) SARS-CoV-2 target nucleic acids are NOT DETECTED.  The SARS-CoV-2 RNA is generally detectable in upper respiratory specimens during the acute phase of infection. The lowest concentration of SARS-CoV-2 viral copies this assay can detect is 138 copies/mL. A negative result does not preclude SARS-Cov-2 infection and should not be used as the sole basis for treatment or other patient management decisions. A negative result may occur with  improper specimen collection/handling, submission of specimen other than nasopharyngeal swab, presence of viral mutation(s) within the areas targeted by this assay, and inadequate number of viral copies(<138 copies/mL). A negative result must be combined with clinical observations, patient history, and epidemiological information. The expected result is Negative.  Fact Sheet for Patients:   BloggerCourse.com  Fact Sheet for Healthcare Providers:  SeriousBroker.it  This test is no t yet approved or cleared by the [place] FDA and  has been authorized for detection and/or diagnosis of SARS-CoV-2 by FDA under an Emergency Use Authorization (EUA). This EUA will remain  in effect (meaning this test can be used) for the duration of the COVID-19 declaration under Section 564(b)(1) of the Act, 21 U.S.C.section 360bbb-3(b)(1), unless the authorization is terminated  or revoked sooner.       Influenza A by PCR NEGATIVE NEGATIVE Final   Influenza B by PCR NEGATIVE NEGATIVE Final    Comment: (NOTE) The Xpert Xpress SARS-CoV-2/FLU/RSV plus assay is intended as an aid in the diagnosis of influenza from Nasopharyngeal swab specimens and should not be used as a sole basis for treatment. Nasal washings and aspirates are unacceptable for Xpert Xpress SARS-CoV-2/FLU/RSV testing.  Fact Sheet for Patients: BloggerCourse.com  Fact Sheet for Healthcare Providers: SeriousBroker.it  This test is not yet approved or cleared by the [place] FDA and has been authorized for detection and/or diagnosis of SARS-CoV-2 by FDA under an Emergency Use Authorization (EUA). This EUA will remain in effect (meaning this test can be used) for the duration of the COVID-19 declaration under Section 564(b)(1) of the Act, 21 U.S.C. section 360bbb-3(b)(1), unless the authorization is terminated or revoked.     Resp Syncytial Virus by PCR NEGATIVE NEGATIVE Final    Comment: (NOTE) Fact Sheet for Patients: BloggerCourse.com  Fact Sheet for Healthcare Providers: SeriousBroker.it  This test is not yet approved or cleared by the [place] FDA and has been authorized for detection and/or diagnosis of SARS-CoV-2 by FDA under an Emergency Use  Authorization (EUA). This EUA will remain in effect (meaning this test can be used) for the duration of the COVID-19 declaration under Section 564(b)(1) of the Act, 21 U.S.C. section 360bbb-3(b)(1), unless the authorization is terminated or revoked.  Performed at [facility], [address] [place], [place] [address]     Radiology Studies: CT HEAD WO CONTRAST ( ) Result Date: 09/15/2023 CLINICAL DATA:  48 year old female with neurologic deficit. Abnormal right inferior cerebellum, cerebellar vermis on MRI. And right vertebral artery occlusion on CTA. EXAM: CT HEAD WITHOUT CONTRAST TECHNIQUE: Contiguous axial images were obtained from the base of the skull through the vertex without intravenous contrast. RADIATION DOSE REDUCTION: This exam was performed according to the departmental dose-optimization program which includes automated exposure control, adjustment of the mA and/or kV according to patient size  and/or use of iterative reconstruction technique. COMPARISON:  Brain MRI and head CT 09/13/2023 FINDINGS: Brain: Hypodensity compatible with patchy cytotoxic edema which is more apparent by CT since 09/13/2023 (coronal image 46). No hemorrhagic transformation. No definite extension from the comparison MRI. No posterior fossa mass effect. Patent 4th ventricle. And otherwise stable posterior fossa gray-white differentiation. No supratentorial midline shift, ventriculomegaly, mass effect, evidence of mass lesion, intracranial hemorrhage or evidence of cortically based acute infarction. Supratentorial gray-white matter differentiation is within normal limits throughout the brain. Vascular: Subtle asymmetric hyperdensity of the distal right vertebral artery. Skull: Stable, intact. Sinuses/Orbits: Visualized paranasal sinuses and mastoids are stable and well aerated. Other: Visualized orbits and scalp soft tissues are within normal limits. IMPRESSION: 1. Expected evolution of small  right cerebellar infarct since 09/13/2023. No hemorrhagic transformation or mass effect. 2. No new  intracranial abnormality. Electronically Signed   By: Odessa Fleming M.D.   On: 09/15/2023 05:58      Adem Costlow T. Addysin Porco [facility]  If 7PM-7AM, please contact night-coverage [email or web address] 09/15/2023, 2:44 PM

## 2023-09-15 NOTE — Progress Notes (Signed)
 STROKE TEAM PROGRESS NOTE   INTERIM HISTORY/SUBJECTIVE Husband at bedside.  Patient lying bed, stated dizziness is much better, still has mild headache.  TCD bubble study pending  OBJECTIVE  CBC    Component Value Date/Time   WBC 5.2 09/15/2023 0849   RBC 4.31 09/15/2023 0849   HGB 8.2 (L) 09/15/2023 0849   HCT 30.3 (L) 09/15/2023 0849   PLT 395 09/15/2023 0849   MCV 70.3 (L) 09/15/2023 0849   MCH 19.0 (L) 09/15/2023 0849   MCHC 27.1 (L) 09/15/2023 0849   RDW 18.1 (H) 09/15/2023 0849   LYMPHSABS 1.4 01/31/2010 1507   MONOABS 0.5 01/31/2010 1507   EOSABS 0.1 01/31/2010 1507   BASOSABS 0.0 01/31/2010 1507    BMET    Component Value Date/Time   NA 135 09/15/2023 0849   K 3.7 09/15/2023 0849   CL 110 09/15/2023 0849   CO2 22 09/15/2023 0849   GLUCOSE 87 09/15/2023 0849   BUN 6 09/15/2023 0849   CREATININE 0.68 09/15/2023 0849   CALCIUM 8.6 (L) 09/15/2023 0849   GFRNONAA >60 09/15/2023 0849    IMAGING past 24 hours CT HEAD WO CONTRAST ( ) Result Date: 09/15/2023 CLINICAL DATA:  48 year old female with neurologic deficit. Abnormal right inferior cerebellum, cerebellar vermis on MRI. And right vertebral artery occlusion on CTA. EXAM: CT HEAD WITHOUT CONTRAST TECHNIQUE: Contiguous axial images were obtained from the base of the skull through the vertex without intravenous contrast. RADIATION DOSE REDUCTION: This exam was performed according to the departmental dose-optimization program which includes automated exposure control, adjustment of the mA and/or kV according to patient size and/or use of iterative reconstruction technique. COMPARISON:  Brain MRI and head CT 09/13/2023 FINDINGS: Brain: Hypodensity compatible with patchy cytotoxic edema which is more apparent by CT since 09/13/2023 (coronal image 46). No hemorrhagic transformation. No definite extension from the comparison MRI. No posterior fossa mass effect. Patent 4th ventricle. And otherwise stable posterior fossa  gray-white differentiation. No supratentorial midline shift, ventriculomegaly, mass effect, evidence of mass lesion, intracranial hemorrhage or evidence of cortically based acute infarction. Supratentorial gray-white matter differentiation is within normal limits throughout the brain. Vascular: Subtle asymmetric hyperdensity of the distal right vertebral artery. Skull: Stable, intact. Sinuses/Orbits: Visualized paranasal sinuses and mastoids are stable and well aerated. Other: Visualized orbits and scalp soft tissues are within normal limits. IMPRESSION: 1. Expected evolution of small right cerebellar infarct since 09/13/2023. No hemorrhagic transformation or mass effect. 2. No new  intracranial abnormality. Electronically Signed   By: Odessa Fleming M.D.   On: 09/15/2023 05:58    Vitals:   09/15/23 0730 09/15/23 1053 09/15/23 1612 09/15/23 2004  BP: (!) 99/59 102/68 95/68 92/65   Pulse: 71 73 81 73  Resp: 19 17 17 18   Temp: 98.7 F (37.1 C) 98.5 F (36.9 C) 98.6 F (37 C) 98.5 F (36.9 C)  TempSrc: Oral Oral Oral Oral  SpO2: 93% 100% 99% 97%  Weight:      Height:         PHYSICAL EXAM General:  Alert, well-nourished, well-developed patient in no acute distress Psych:  Mood and affect appropriate for situation CV: Regular rate and rhythm on monitor Respiratory:  Regular, unlabored respirations on room air GI: Abdomen soft and nontender   NEURO:  Mental Status: AA&Ox3, patient is able to give clear and coherent history Speech/Language: speech is without dysarthria or aphasia.  Naming, repetition, fluency, and comprehension intact.  Cranial Nerves:  II: PERRL. Visual fields full.  III, IV,  VI: EOMI. Eyelids elevate symmetrically.  V: Sensation is intact to light touch and symmetrical to face.  VII: Face is symmetrical resting and smiling VIII: hearing intact to voice. IX, X: Palate elevates symmetrically. Phonation is normal.  YQ:MVHQIONG shrug 5/5. XII: tongue is midline without  fasciculations. Motor: 5/5 strength to all muscle groups tested.  Tone: is normal and bulk is normal Sensation- Intact to light touch bilaterally. Extinction absent to light touch to DSS.   Coordination: FTN intact bilaterally, HKS: no ataxia in BLE.No drift.  Gait- deferred Endorses dizziness with walking Most Recent NIH 0     ASSESSMENT/PLAN  Ms. NONA GRACEY is a 48 y.o. female with history of endometriosis who presented initially to [facility] for assessment of acute onset vertigo evolving to right sided headache with right neck pain, N/V and listing to the right.  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 MRI  Small area of edema or encephalomalacia in the inferior right cerebellum. CT repeat stable cerebellar infarct, no hydrocephalus 2D Echo EF 60-65% TCD w bubble study  pending  LDL 110 HgbA1c 5.3 UDS negative VTE prophylaxis - lovenox No antithrombotic prior to admission, now on aspirin 81 mg daily and clopidogrel 75 mg daily for 3 months and then ASA 81mg  alone. Repeat CTA head neck in 2 months as outpatient Therapy recommendations:  Outpatient PT/OT/ST Disposition:  Pending  Headache with neck pain Oxycodone ordered by primary team Will additionally order 2 days of fiorcet  BP measurement Stable on the low end Avoid low BP Long-term BP goal normotensive  Hyperlipidemia Home meds none LDL 110, goal < 70 Add atorvastatin 40mg  Continue statin at discharge  Other Active Problems Endometriosis  Macrocytic anemia, may be related to endometriosis, hemoglobin 9.0--7.9, low iron and ferritin level, on iron pills.  Plan for IV iron  infusion  Hospital day # 1  Marvel Plan, MD PhD Stroke Neurology 09/15/2023 8:21 PM  To contact Stroke Continuity provider, please refer to [email or web address]. After hours, contact General Neurology

## 2023-09-16 ENCOUNTER — Observation Stay (HOSPITAL_COMMUNITY)

## 2023-09-16 ENCOUNTER — Telehealth: Payer: Self-pay

## 2023-09-16 ENCOUNTER — Other Ambulatory Visit (HOSPITAL_COMMUNITY): Payer: Self-pay

## 2023-09-16 DIAGNOSIS — I639 Cerebral infarction, unspecified: Secondary | ICD-10-CM | POA: Diagnosis not present

## 2023-09-16 DIAGNOSIS — I7774 Dissection of vertebral artery: Secondary | ICD-10-CM | POA: Diagnosis not present

## 2023-09-16 DIAGNOSIS — D500 Iron deficiency anemia secondary to blood loss (chronic): Secondary | ICD-10-CM | POA: Diagnosis not present

## 2023-09-16 DIAGNOSIS — R42 Dizziness and giddiness: Secondary | ICD-10-CM | POA: Diagnosis not present

## 2023-09-16 LAB — CBC
HCT: 29.6 % — ABNORMAL LOW (ref 36.0–46.0)
Hemoglobin: 8.3 g/dL — ABNORMAL LOW (ref 12.0–15.0)
MCH: 19.6 pg — ABNORMAL LOW (ref 26.0–34.0)
MCHC: 28 g/dL — ABNORMAL LOW (ref 30.0–36.0)
MCV: 69.8 fL — ABNORMAL LOW (ref 80.0–100.0)
Platelets: 379 10*3/uL (ref 150–400)
RBC: 4.24 MIL/uL (ref 3.87–5.11)
RDW: 18.2 % — ABNORMAL HIGH (ref 11.5–15.5)
WBC: 6 10*3/uL (ref 4.0–10.5)
nRBC: 0 % (ref 0.0–0.2)

## 2023-09-16 LAB — MAGNESIUM: Magnesium: 2.3 mg/dL (ref 1.7–2.4)

## 2023-09-16 LAB — RENAL FUNCTION PANEL
Albumin: 3.3 g/dL — ABNORMAL LOW (ref 3.5–5.0)
Anion gap: 5 (ref 5–15)
BUN: 11 mg/dL (ref 6–20)
CO2: 18 mmol/L — ABNORMAL LOW (ref 22–32)
Calcium: 8.1 mg/dL — ABNORMAL LOW (ref 8.9–10.3)
Chloride: 110 mmol/L (ref 98–111)
Creatinine, Ser: 0.67 mg/dL (ref 0.44–1.00)
GFR, Estimated: >60 mL/min (ref >60)
Glucose, Bld: 85 mg/dL (ref 70–99)
Phosphorus: 3.8 mg/dL (ref 2.5–4.6)
Potassium: 3.6 mmol/L (ref 3.5–5.1)
Sodium: 133 mmol/L — ABNORMAL LOW (ref 135–145)

## 2023-09-16 MED ORDER — ATORVASTATIN CALCIUM 40 MG PO TABS
40.0000 mg | ORAL_TABLET | Freq: Every day | ORAL | 0 refills | Status: AC
  Filled 2023-09-16: qty 30, 30d supply, fill #0
  Filled 2023-10-16: qty 30, 30d supply, fill #1
  Filled 2023-11-17: qty 30, 30d supply, fill #2

## 2023-09-16 MED ORDER — ACETAMINOPHEN 325 MG PO TABS: 650.0000 mg | ORAL_TABLET | ORAL | Status: AC | PRN

## 2023-09-16 MED ORDER — ASPIRIN 81 MG PO TBEC: 81.0000 mg | DELAYED_RELEASE_TABLET | Freq: Every day | ORAL | Status: AC

## 2023-09-16 MED ORDER — SENNOSIDES-DOCUSATE SODIUM 8.6-50 MG PO TABS: 1.0000 | ORAL_TABLET | Freq: Two times a day (BID) | ORAL | Status: AC | PRN

## 2023-09-16 MED ORDER — CLOPIDOGREL BISULFATE 75 MG PO TABS
75.0000 mg | ORAL_TABLET | Freq: Every day | ORAL | 0 refills | Status: AC
  Filled 2023-09-16: qty 30, 30d supply, fill #0
  Filled 2023-10-16: qty 30, 30d supply, fill #1
  Filled 2023-11-17: qty 30, 30d supply, fill #2

## 2023-09-16 MED ORDER — FERROUS SULFATE 325 (65 FE) MG PO TABS: 325.0000 mg | ORAL_TABLET | Freq: Two times a day (BID) | ORAL | Status: AC

## 2023-09-16 NOTE — Progress Notes (Signed)
 STROKE TEAM PROGRESS NOTE   INTERIM HISTORY/SUBJECTIVE Pt seen in the vascular lab.  Patient Kayla Strickland, still had HA this morning bu now resolved. Dizziness much better. TCD bubble study showed small PFO.   OBJECTIVE  CBC    Component Value Date/Time   WBC 6.0 09/16/2023 0710   RBC 4.24 09/16/2023 0710   HGB 8.Strickland (L) 09/16/2023 0710   HCT 29.6 (L) 09/16/2023 0710   PLT 379 09/16/2023 0710   MCV 69.8 (L) 09/16/2023 0710   MCH 19.6 (L) 09/16/2023 0710   MCHC 28.0 (L) 09/16/2023 0710   RDW 18.2 (H) 09/16/2023 0710   LYMPHSABS 1.4 01/31/2010 1507   MONOABS 0.5 01/31/2010 1507   EOSABS 0.1 01/31/2010 1507   BASOSABS 0.0 01/31/2010 1507    BMET    Component Value Date/Time   NA 133 (L) 09/16/2023 0710   K Strickland.6 09/16/2023 0710   CL 110 09/16/2023 0710   CO2 18 (L) 09/16/2023 0710   GLUCOSE 85 09/16/2023 0710   BUN 11 09/16/2023 0710   CREATININE 0.67 09/16/2023 0710   CALCIUM 8.1 (L) 09/16/2023 0710   GFRNONAA >60 09/16/2023 0710    IMAGING past 24 hours No results found.   Vitals:   09/15/23 2333 09/16/23 0331 09/16/23 0741 09/16/23 1134  BP: 100/61 97/64 95/67  94/62  Pulse: 77  66 69  Resp: 18 18 17 17   Temp: 98 F (36.7 C) 98.7 F (37.1 C) 98.2 F (36.8 C) 98.4 F (36.9 C)  TempSrc: Oral  Oral Oral  SpO2: 91%  99% 99%  Weight:      Height:         PHYSICAL EXAM General:  Alert, well-nourished, well-developed patient in no acute distress Psych:  Mood and affect appropriate for situation CV: Regular rate and rhythm on monitor Respiratory:  Regular, unlabored respirations on room air GI: Abdomen soft and nontender   NEURO:  Mental Status: AA&Ox3, patient is able to give clear and coherent history Speech/Language: speech is without dysarthria or aphasia.  Naming, repetition, fluency, and comprehension intact.  Cranial Nerves:  II: PERRL. Visual fields full.  III, IV, VI: EOMI. Eyelids elevate symmetrically.  V: Sensation is intact to light touch and  symmetrical to face.  VII: Face is symmetrical resting and smiling VIII: hearing intact to voice. IX, X: Palate elevates symmetrically. Phonation is normal.  ZO:[id number] shrug 5/5. XII: tongue is midline without fasciculations. Motor: 5/5 strength to all muscle groups tested.  Tone: is normal and bulk is normal Sensation- Intact to light touch bilaterally. Extinction absent to light touch to DSS.   Coordination: FTN intact bilaterally, HKS: no ataxia in BLE.No drift.  Gait- deferred Endorses dizziness with walking Most Recent NIH 0     ASSESSMENT/PLAN  Kayla Strickland is a 48 y.o. female with history of endometriosis who presented initially to [facility] for assessment of acute onset vertigo evolving to right sided headache with right neck pain, N/V and listing to the right.  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 MRI  Small area of edema or encephalomalacia in the inferior right cerebellum. CT repeat stable cerebellar infarct, no hydrocephalus 2D Echo EF 60-65%  TCD w bubble study spencer degree 1 at rest and Strickland with valsalva, indicating small PFO LDL 110 HgbA1c 5.Strickland UDS negative VTE prophylaxis - lovenox No antithrombotic prior to admission, now on aspirin 81 mg daily and clopidogrel 75 mg daily for Strickland months and then ASA 81mg  alone. Repeat CTA head neck in 2 months as outpatient Therapy recommendations:  Outpatient PT/OT/ST Disposition:  Pending  Headache with neck pain Oxycodone ordered by primary team Will additionally order 2 days of fiorcet  PFO TCD w bubble study spencer degree 1 at rest and Strickland with valsalva, indicating small PFO Not clear if  it is related to VA occlusion ROPE = 8 Will refer to structural cardiology for second opinion.   BP measurement Stable on the low end Avoid low BP Long-term BP goal normotensive  Hyperlipidemia Home meds none LDL 110, goal < 70 Add atorvastatin 40mg  Continue statin at discharge  Other Active Problems Endometriosis  Macrocytic anemia, may be related to endometriosis, hemoglobin 9.0--7.9, low iron and ferritin level, on iron pills.  S/p IV iron infusion  Hospital day # 1  Neurology will sign off. Please call with questions. Pt will follow up with stroke clinic Dr. Pearlean Brownie at [facility] in about 4-6 weeks. Thanks for the consult.   Marvel Plan, MD PhD Stroke Neurology Strickland/12/2023 Strickland:23 PM  To contact Stroke Continuity provider, please refer to [email or web address]. After hours, contact General Neurology

## 2023-09-16 NOTE — Evaluation (Signed)
 Speech Language Pathology Evaluation Patient Details Name: Kayla Strickland MRN: [id number] DOB: [birth year] Today's Date: 09/16/2023 Time: 6578-4696 SLP Time Calculation (min) (ACUTE ONLY): 33 min  Problem List:  Patient Active Problem List   Diagnosis Date Noted   Dizziness 09/14/2023   Anemia 09/14/2023   Acute CVA (cerebrovascular accident) (HCC) 09/14/2023   Vertebral artery dissection (HCC) 09/13/2023   Cesarean delivery delivered 08/23/2011   Chorioamnionitis, delivered, current hospitalization 08/23/2011   Post-term pregnancy, 40-42 weeks of gestation 08/22/2011   Past Medical History:  Past Medical History:  Diagnosis Date   Endometriosis    Medical history non-contributory    Past Surgical History:  Past Surgical History:  Procedure Laterality Date   CESAREAN SECTION  08/22/2011   Procedure: CESAREAN SECTION;  Surgeon: Roseanna Rainbow, MD;  Location: [facility];  Service: Gynecology;  Laterality: N/A;  Primary cesarean section with delivery of baby girl at 58. Apgars 8/9.   CESAREAN SECTION N/A 01/24/2013   Procedure: CESAREAN SECTION;  Surgeon: Kathreen Cosier, MD;  Location: [facility];  Service: Obstetrics;  Laterality: N/A;  repeat cesarean section with removal of a portion of the Right fallopian tube   LAPAROSCOPIC ENDOMETRIOSIS FULGURATION     OOPHORECTOMY     HPI:  (P) 47yo female admitted 09/13/23 with vertigo and headache x3 days. PMH: endometriosis. MRI = possible right cerebellar infarct. CTHead = expected evolution of infarct.   Assessment / Plan / Recommendation Clinical Impression  Pt presents with mild neurocognitive deficits, specifically attention and recall.   Pt awake and alert, pleasant and cooperative. Pt husband present. Pt is bilingual with Spanish her native language. She has been fluent in [place] for 20+ years She reports independence with driving, household responsibilities, finances and medication prior to admit. She works in Automotive engineer at [employer] and went to college for 2 years in [place]. They have 2 daughters (10 and 12).   Speech is intelligible. Vocal intensity is mildly decreased, which is new since admit (pt reports headache pain meds given earlier, which may be associated to lower volume). Receptive and Expressive Language appear intact. Pt does report mild difficulty with processing Spanish vs English since admit.  The [employer] Mental Status (SLUMS) Examination was administered. Pt scored 20/30, raising concern for mild neurocognitive deficits. Points were lost on orientation to day of week, digit reversal, hand placement on clock drawing, and auditory attention and recall. Additional high level cognitive assessment is recommended through home health or outpatient speech therapy. Pt/spouse receptive to review of results and recommendations. Acute ST signing off.    SLP Assessment  SLP Recommendation/Assessment: All further Speech Language Pathology needs can be addressed in the next venue of care via home health or outpatient speech therapy.   SLP Visit Diagnosis: Cognitive communication deficit (R41.841)    Recommendations for follow up therapy are one component of a multi-disciplinary discharge planning process, led by the attending physician.  Recommendations may be updated based on patient status, additional functional criteria and insurance authorization.    Follow Up Recommendations  HH or OP ST   Assistance Recommended at Discharge  Intermittent Supervision/Assistance  Functional Status Assessment Patient has had a recent decline in their functional status and demonstrates the ability to make significant improvements in function in a reasonable and predictable amount of time.     SLP Evaluation Cognition  Overall Cognitive Status: Impaired/Different from baseline Arousal/Alertness: Awake/alert Orientation Level: Oriented X4 (difficulty with day of week)  Comprehension  Auditory  Comprehension Overall Auditory Comprehension: Appears within functional limits for tasks assessed    Expression Expression Primary Mode of Expression: Verbal Verbal Expression Overall Verbal Expression: Appears within functional limits for tasks assessed Written Expression Dominant Hand: Right   Oral / Motor  Oral Motor/Sensory Function Overall Oral Motor/Sensory Function: Within functional limits (slight abnormal movement right lip, husband reports is baseline due to braces) Motor Speech Overall Motor Speech: Appears within functional limits for tasks assessed Articulation: Within functional limitis            Shandel Busic B. Murvin Natal, [facility], CCC-SLP Speech Language Pathologist Office: [phone]  Kayla Strickland 09/16/2023, 11:13 AM

## 2023-09-16 NOTE — Telephone Encounter (Signed)
 See messages below.  Will call the patient once discharged and schedule her for PFO consult.    Orbie Pyo, MD  Marvel Plan, MD; Tonny Bollman, MD; Henrietta Dine, RN Ok, sounds good       Previous Messages    ----- Message ----- From: Marvel Plan, MD Sent: 09/16/2023   3:30 PM EST To: Tonny Bollman, MD; Orbie Pyo, MD Subject: Inpatient Notes                                Kayla Strickland and Kayla Strickland, this lady had VA occlusion with cerebellar infarcts. No specific factors for dissection or occlusion. TCD bubble showed small PFO on valsalva. Not totally convinced its relation to VA occlusion but her ROPE = 8, and will refer to you for second opinion. May be outpt TEE needed. Thank you.  - Jindong

## 2023-09-16 NOTE — Discharge Summary (Signed)
 Physician Discharge Summary  SIMRANJIT THAYER ZOX:[id number] DOB: 09-28-1975 DOA: 09/13/2023  PCP: Pcp, No  Admit date: 09/13/2023 Discharge date: 09/16/23  Admitted From: Home Disposition: Home Recommendations for Outpatient Follow-up:  Follow-up with PCP in 1 to 2 weeks Follow-up with neurology in 8 weeks Recommend follow-up with gynecology as soon as possible Check CMP and CBC in 1 week Neurology recommended repeat CT angio head and neck in 2 months. Please follow up on the following pending results: None  Home Health: Ambulatory referral to speech therapy Equipment/Devices: No need identified  Discharge Condition: Stable CODE STATUS: Full code  Follow-up Information     [facility]. Schedule an appointment as soon as possible for a visit.   Specialty: Rehabilitation Contact information: [address] [place] [address] [phone]        [facility] [place] Follow up on 11/15/2023.   Specialty: Family Medicine Why: your appointment is at 8:30 am. Please arrive early and bring a photo ID, insurance card and current medications Contact information: [address] [place] [address] [phone] Additional information: [address] [place], [place] [address]        [facility] Schedule an appointment as soon as possible for a visit in 8 week(s).   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  Neurology consulted.  Patient  was started on aspirin and Plavix.  TTE without significant finding.  A1c was 5.3%.  LDL 110.  Repeat CT head on 3/5 with expected evolution of right cerebral CVA but no other acute finding.  Transcranial Doppler with concern for small PFO.  Neurology ordered referral to structural cardiologist for second opinion.  On the day of discharge, patient's symptoms resolved.  She scored 20/30 on cognitive-linguistic assessment  therapy although this might not be reliable since patient was somewhat sleep deprived and received oxycodone earlier.  She needs reevaluation when she is back to baseline.  Ambulatory referral to SLP ordered.   Anemia panel with severe iron deficiency.  Received IV Venofer 300 mg x 1 on 09/15/2023.  H&H remained stable.  She is discharged on p.o. iron 325 mg twice daily.  Recommend outpatient follow-up with gynecology as soon as possible.  See individual problem list below for more.   Problems addressed during this hospitalization Acute right cerebellar and vermis infarct-noted on MRI. Right vertebral artery dissection-CT angio head and neck with diminished caliber and opacification of right vertebral artery from the origin to distal V2 segment, occlusion of right VA from proximal V3 segment to distal V4 segment concerning for vertebral dissection.  Repeat CT head with expected evolution of right cerebral CVA.  TTE without significant finding.  UDS negative.  A1c 5.3%.  LDL 110.  Transcranial Doppler with concern for small PFO.  Neurology ordered referral to structural cardiologist for second opinion. -Neurology recommended aspirin and Plavix for 3 months followed by aspirin alone -Repeat CT angio head and neck in 2 months per outpatient -Ambulatory referral for SLP.  Right-sided headache, vertigo, nausea and vomiting: Likely due to the above.  Resolved except for intermittent headache. -Tylenol as needed for headache   Hyperlipidemia: LDL 110.  Goal less than 70. -Lipitor 40 mg  daily   Iron deficiency anemia likely due to abnormal uterine bleeding in the setting of endometriosis and possible fibroid: Anemia panel with severe iron deficiency.  Received IV Venofer 300 mg x 1.  Hgb improved after initial drop. -Discharged on p.o. iron twice daily -Recommend repeat anemia panel in 4 to 6 weeks.  She may benefit from outpatient iron infusion. -Encouraged to follow-up with gynecology as soon as possible.   Hypokalemia: Resolved  Hyponatremia: Mild -Repeat outpatient            Time spent 35 minutes  Vital signs Vitals:   09/15/23 2333 09/16/23 0331 09/16/23 0741 09/16/23 1134  BP: 100/61 97/64 95/67  94/62  Pulse: 77  66 69  Temp: 98 F (36.7 C) 98.7 F (37.1 C) 98.2 F (36.8 C) 98.4 F (36.9 C)  Resp: 18 18 17 17   Height:      Weight:      SpO2: 91%  99% 99%  TempSrc: Oral  Oral Oral  BMI (Calculated):         Discharge exam  GENERAL: No apparent distress.  Nontoxic. HEENT: MMM.  Vision and hearing grossly intact.  NECK: Supple.  No apparent JVD.  RESP:  No IWOB.  Fair aeration bilaterally. CVS:  RRR. Heart sounds normal.  ABD/GI/GU: BS+. Abd soft, NTND.  MSK/EXT:  Moves extremities. No apparent deformity. No edema.  SKIN: no apparent skin lesion or wound NEURO: Awake and alert. Oriented appropriately.  No apparent focal neuro deficit. PSYCH: Calm. Normal affect.   Discharge Instructions Discharge Instructions     Ambulatory referral to Neurology   Complete by: As directed    An appointment is requested in approximately: 8 weeks   Ambulatory referral to Physical Therapy   Complete by: As directed    Ambulatory referral to Speech Therapy   Complete by: As directed    Diet general   Complete by: As directed    Discharge instructions   Complete by: As directed    It has been a pleasure taking care of you!  You were hospitalized due to stroke for which you have been started on medications.  Your symptoms improved.  It is very  important that you take these medications to prevent further stroke.  Follow-up with your primary care doctor in 1 to 2 weeks or sooner if needed.  Follow-up with neurology in 4 to 6 weeks.  We also recommend follow-up with gynecologist as soon as possible.   Take care,   Increase activity slowly   Complete by: As directed       Allergies as of 09/16/2023   No Known Allergies      Medication List     STOP taking these medications    benzonatate 200 MG capsule Commonly known as: TESSALON   lidocaine 2 % solution Commonly known as: XYLOCAINE   oseltamivir 75 MG capsule Commonly known as: TAMIFLU       TAKE these medications    acetaminophen 325 MG tablet Commonly known as: TYLENOL Take 2 tablets (650 mg total) by mouth every 4 (four) hours as needed for mild pain (pain score 1-3) (or temp > 37.5 C (99.5 F)).   aspirin EC 81 MG tablet Take 1 tablet (81 mg total) by mouth daily. Swallow whole.  atorvastatin 40 MG tablet Commonly known as: LIPITOR Take 1 tablet (40 mg total) by mouth daily.   clopidogrel 75 MG tablet Commonly known as: PLAVIX Take 1 tablet (75 mg total) by mouth daily.   ferrous sulfate 325 (65 FE) MG tablet Take 1 tablet (325 mg total) by mouth 2 (two) times daily with a meal.   senna-docusate 8.6-50 MG tablet Commonly known as: Senokot-S Take 1 tablet by mouth 2 (two) times daily between meals as needed for mild constipation.        Consultations: Neurology  Procedures/Studies:    CT HEAD WO CONTRAST ( ) Result Date: 09/15/2023 CLINICAL DATA:  48 year old female with neurologic deficit. Abnormal right inferior cerebellum, cerebellar vermis on MRI. And right vertebral artery occlusion on CTA. EXAM: CT HEAD WITHOUT CONTRAST TECHNIQUE: Contiguous axial images were obtained from the base of the skull through the vertex without intravenous contrast. RADIATION DOSE REDUCTION: This exam was performed according to the departmental  dose-optimization program which includes automated exposure control, adjustment of the mA and/or kV according to patient size and/or use of iterative reconstruction technique. COMPARISON:  Brain MRI and head CT 09/13/2023 FINDINGS: Brain: Hypodensity compatible with patchy cytotoxic edema which is more apparent by CT since 09/13/2023 (coronal image 46). No hemorrhagic transformation. No definite extension from the comparison MRI. No posterior fossa mass effect. Patent 4th ventricle. And otherwise stable posterior fossa gray-white differentiation. No supratentorial midline shift, ventriculomegaly, mass effect, evidence of mass lesion, intracranial hemorrhage or evidence of cortically based acute infarction. Supratentorial gray-white matter differentiation is within normal limits throughout the brain. Vascular: Subtle asymmetric hyperdensity of the distal right vertebral artery. Skull: Stable, intact. Sinuses/Orbits: Visualized paranasal sinuses and mastoids are stable and well aerated. Other: Visualized orbits and scalp soft tissues are within normal limits. IMPRESSION: 1. Expected evolution of small right cerebellar infarct since 09/13/2023. No hemorrhagic transformation or mass effect. 2. No new  intracranial abnormality. Electronically Signed   By: Odessa Fleming M.D.   On: 09/15/2023 05:58   ECHOCARDIOGRAM COMPLETE Result Date: 09/14/2023    ECHOCARDIOGRAM REPORT   Patient Name:   SHAUNITA SENEY Date of Exam: 09/14/2023 Medical Rec #:  [id number]        Height:       64.0 in Accession #:    [id number]       Weight:       150.0 lb Date of Birth:  1976-03-20        BSA:          1.731 m Patient Age:    47 years         BP:           111/61 mmHg Patient Gender: F                HR:           74 bpm. Exam Location:  Inpatient Procedure: 2D Echo, Cardiac Doppler and Color Doppler (Both Spectral and Color            Flow Doppler were utilized during procedure). Indications:   Stroke I63.9  History:       Patient has no prior  history of Echocardiogram examinations.                Stroke.  Sonographer:   Webb Laws Referring      [id number] JINDONG XU Phys: IMPRESSIONS  1. Left ventricular ejection fraction, by estimation, is 60 to 65%. The left ventricle has normal function.  The left ventricle has no regional wall motion abnormalities. Left ventricular diastolic parameters were normal.  2. Right ventricular systolic function is normal. The right ventricular size is normal. There is normal pulmonary artery systolic pressure. The estimated right ventricular systolic pressure is 25.5 mmHg.  3. The mitral valve is grossly normal. Trivial mitral valve regurgitation. No evidence of mitral stenosis.  4. The aortic valve is tricuspid. Aortic valve regurgitation is not visualized. No aortic stenosis is present.  5. The inferior vena cava is normal in size with greater than 50% respiratory variability, suggesting right atrial pressure of 3 mmHg. Conclusion(s)/Recommendation(s): No intracardiac source of embolism detected on this transthoracic study. Consider a transesophageal echocardiogram to exclude cardiac source of embolism if clinically indicated. FINDINGS  Left Ventricle: Left ventricular ejection fraction, by estimation, is 60 to 65%. The left ventricle has normal function. The left ventricle has no regional wall motion abnormalities. The left ventricular internal cavity size was normal in size. There is  no left ventricular hypertrophy. Left ventricular diastolic parameters were normal. Right Ventricle: The right ventricular size is normal. No increase in right ventricular wall thickness. Right ventricular systolic function is normal. There is normal pulmonary artery systolic pressure. The tricuspid regurgitant velocity is 2.37 m/s, and  with an assumed right atrial pressure of 3 mmHg, the estimated right ventricular systolic pressure is 25.5 mmHg. Left Atrium: Left atrial size was normal in size. Right Atrium: Right atrial size was  normal in size. Pericardium: There is no evidence of pericardial effusion. Mitral Valve: The mitral valve is grossly normal. Trivial mitral valve regurgitation. No evidence of mitral valve stenosis. Tricuspid Valve: The tricuspid valve is grossly normal. Tricuspid valve regurgitation is mild . No evidence of tricuspid stenosis. Aortic Valve: The aortic valve is tricuspid. Aortic valve regurgitation is not visualized. No aortic stenosis is present. Pulmonic Valve: The pulmonic valve was grossly normal. Pulmonic valve regurgitation is not visualized. No evidence of pulmonic stenosis. Aorta: The aortic root and ascending aorta are structurally normal, with no evidence of dilitation. Venous: The inferior vena cava is normal in size with greater than 50% respiratory variability, suggesting right atrial pressure of 3 mmHg. IAS/Shunts: The atrial septum is grossly normal.  LEFT VENTRICLE PLAX 2D LVIDd:         4.40 cm   Diastology LVIDs:         2.70 cm   LV e' medial:    10.00 cm/s LV PW:         0.70 cm   LV E/e' medial:  6.1 LV IVS:        0.90 cm   LV e' lateral:   17.90 cm/s LVOT diam:     1.80 cm   LV E/e' lateral: 3.4 LV SV:         58 LV SV Index:   34 LVOT Area:     2.54 cm  RIGHT VENTRICLE             IVC RV Basal diam:  3.60 cm     IVC diam: 1.10 cm RV S prime:     15.50 cm/s TAPSE (M-mode): 2.4 cm LEFT ATRIUM             Index        RIGHT ATRIUM           Index LA diam:        3.90 cm 2.25 cm/m   RA Area:     15.00 cm LA Vol (  A2C):   35.3 ml 20.39 ml/m  RA Volume:   32.10 ml  18.54 ml/m LA Vol (A4C):   45.7 ml 26.40 ml/m LA Biplane Vol: 44.1 ml 25.47 ml/m  AORTIC VALVE LVOT Vmax:   117.00 cm/s LVOT Vmean:  84.300 cm/s LVOT VTI:    0.228 m  AORTA Ao Root diam: 2.40 cm Ao Asc diam:  2.80 cm MITRAL VALVE               TRICUSPID VALVE MV Area (PHT): 3.74 cm    TR Peak grad:   22.5 mmHg MV Decel Time: 203 msec    TR Vmax:        237.00 cm/s MV E velocity: 61.20 cm/s MV A velocity: 40.20 cm/s  SHUNTS MV E/A  ratio:  1.52        Systemic VTI:  0.23 m                            Systemic Diam: 1.80 cm Lennie Odor MD Electronically signed by Lennie Odor MD Signature Date/Time: 09/14/2023/2:07:05 PM    Final    MR BRAIN WO CONTRAST Result Date: 09/14/2023 CLINICAL DATA:  Neuro deficit, acute, stroke suspected EXAM: MRI HEAD WITHOUT CONTRAST TECHNIQUE: Multiplanar, multiecho pulse sequences of the brain and surrounding structures were obtained without intravenous contrast. COMPARISON:  None Available. FINDINGS: Significant artifact from the patient's braces limits assessment and obscures portions of the anterior frontal lobes, face/orbits, and posterior fossa. Diffusion-weighted and susceptibility weighted imaging are particularly affected. Within this limitation: Brain: Small area of edema or encephalomalacia in the inferior right cerebellum not be evaluated on DWI imaging due to artifact. Otherwise, no visible acute infarction, acute hemorrhage, hydrocephalus, extra-axial collection or mass lesion. Vascular: Absent right vertebral arterial flow void, compatible with findings on same day CTA. Skull and upper cervical spine: Normal marrow signal. Sinuses/Orbits: Obscured. Other: No mastoid effusions. IMPRESSION: 1. Small area of edema or encephalomalacia in the inferior right cerebellum. Findings are concerning for an acute infarct given findings on same day CTA and reported symptoms; however, unfortunately this cannot be confirmed given this area is obscured on diffusion-weighted imaging by artifact from the patient's braces. 2. Otherwise, no visible acute abnormality on this limited MRI. These results will be called to the ordering clinician or representative by the Radiologist Assistant, and communication documented in the PACS or [employer]. Electronically Signed   By: Feliberto Harts M.D.   On: 09/14/2023 01:17   CT Angio Head Neck W WO CM Result Date: 09/13/2023 CLINICAL DATA:  Central vertigo, dizziness  for several days. EXAM: CT ANGIOGRAPHY HEAD AND NECK WITH AND WITHOUT CONTRAST TECHNIQUE: Multidetector CT imaging of the head and neck was performed using the standard protocol during bolus administration of intravenous contrast. Multiplanar CT image reconstructions and MIPs were obtained to evaluate the vascular anatomy. Carotid stenosis measurements (when applicable) are obtained utilizing NASCET criteria, using the distal internal carotid diameter as the denominator. RADIATION DOSE REDUCTION: This exam was performed according to the departmental dose-optimization program which includes automated exposure control, adjustment of the mA and/or kV according to patient size and/or use of iterative reconstruction technique. CONTRAST:  OMNIPAQUE IOHEXOL 350 MG/ML SOLN COMPARISON:  CT head and CTA neck 02/16/2009 FINDINGS: CT HEAD FINDINGS Brain: No acute intracranial hemorrhage. No CT evidence of acute infarct. No edema, mass effect, or midline shift. The basilar cisterns are patent. Ventricles: Ventricles are normal in size and  configuration. Vascular: No hyperdense vessel. Skull: No acute or aggressive finding. Sinuses/orbits: The orbits are symmetric. Minimal secretions in the right sphenoid sinus. Other: Mastoid air cells are clear. CTA NECK FINDINGS Aortic arch: Standard configuration of the aortic arch. Imaged portion shows no evidence of aneurysm or dissection. No significant stenosis of the major arch vessel origins. Pulmonary arteries: As permitted by contrast timing, there are no filling defects in the visualized pulmonary arteries. Subclavian arteries: The subclavian arteries are patent bilaterally. Right carotid system: No evidence of dissection, stenosis (50% or greater), or occlusion. Left carotid system: No evidence of dissection, stenosis (50% or greater), or occlusion. Vertebral arteries: The left vertebral artery is patent from the origin to the vertebrobasilar confluence. There is diminished  caliber and diminished contrast within the right vertebral artery from the origin to the distal V2 segment. There is significantly diminished caliber and ventral occlusion of the right vertebral artery at the proximal V3 segment just distal to the C2 transverse foramen. The right vertebral artery is occluded from the proximal V3 segment to the distal V4 segment. Skeleton: No acute or aggressive finding noted. Other neck: The visualized airway is patent. No cervical lymphadenopathy. Mildly heterogeneous appearance of the thyroid with multiple subcentimeter nodules noted. Upper chest: Visualized lung apices are clear. Review of the MIP images confirms the above findings CTA HEAD FINDINGS ANTERIOR CIRCULATION: The intracranial ICAs are patent bilaterally. No significant stenosis, proximal occlusion, aneurysm, or vascular malformation. MCAs: The middle cerebral arteries are patent bilaterally. ACAs: The anterior cerebral arteries are patent bilaterally. POSTERIOR CIRCULATION: No significant stenosis, proximal occlusion, aneurysm, or vascular malformation. PCAs: The posterior cerebral arteries are patent bilaterally. Pcomm: Not well visualized. SCAs: The superior cerebellar arteries are patent bilaterally. Basilar artery: Patent AICAs: Patent PICAs: Patent on the left. There are diminutive branches of the right PICA noted. Venous sinuses: As permitted by contrast timing, patent. Anatomic variants: None Review of the MIP images confirms the above findings IMPRESSION: 1. Diminished caliber and opacification of the right vertebral artery from the origin to the distal V2 segment. 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 2. No acute intracranial hemorrhage. Electronically Signed   By: Emily Filbert M.D.   On: 09/13/2023 16:32       The results of significant diagnostics from this hospitalization (including imaging,  microbiology, ancillary and laboratory) are listed below for reference.     Microbiology: Recent Results (from the past 240 hours)  Resp panel by RT-PCR (RSV, Flu A&B, Covid) Anterior Nasal Swab     Status: None   Collection Time: 09/13/23 10:50 AM   Specimen: Anterior Nasal Swab  Result Value Ref Range Status   SARS Coronavirus 2 by RT PCR NEGATIVE NEGATIVE Final    Comment: (NOTE) SARS-CoV-2 target nucleic acids are NOT DETECTED.  The SARS-CoV-2 RNA is generally detectable in upper respiratory specimens during the acute phase of infection. The lowest concentration of SARS-CoV-2 viral copies this assay can detect is 138 copies/mL. A negative result does not preclude SARS-Cov-2 infection and should not be used as the sole basis for treatment or other patient management decisions. A negative result may occur with  improper specimen collection/handling, submission of specimen other than nasopharyngeal swab, presence of viral mutation(s) within the areas targeted by this assay, and inadequate number of viral copies(<138 copies/mL). A negative result must be combined with clinical observations, patient history, and epidemiological information. The expected  result is Negative.  Fact Sheet for Patients:  BloggerCourse.com  Fact Sheet for Healthcare Providers:  SeriousBroker.it  This test is no t yet approved or cleared by the [place] FDA and  has been authorized for detection and/or diagnosis of SARS-CoV-2 by FDA under an Emergency Use Authorization (EUA). This EUA will remain  in effect (meaning this test can be used) for the duration of the COVID-19 declaration under Section 564(b)(1) of the Act, 21 U.S.C.section 360bbb-3(b)(1), unless the authorization is terminated  or revoked sooner.       Influenza A by PCR NEGATIVE NEGATIVE Final   Influenza B by PCR NEGATIVE NEGATIVE Final    Comment: (NOTE) The Xpert Xpress  SARS-CoV-2/FLU/RSV plus assay is intended as an aid in the diagnosis of influenza from Nasopharyngeal swab specimens and should not be used as a sole basis for treatment. Nasal washings and aspirates are unacceptable for Xpert Xpress SARS-CoV-2/FLU/RSV testing.  Fact Sheet for Patients: BloggerCourse.com  Fact Sheet for Healthcare Providers: SeriousBroker.it  This test is not yet approved or cleared by the [place] FDA and has been authorized for detection and/or diagnosis of SARS-CoV-2 by FDA under an Emergency Use Authorization (EUA). This EUA will remain in effect (meaning this test can be used) for the duration of the COVID-19 declaration under Section 564(b)(1) of the Act, 21 U.S.C. section 360bbb-3(b)(1), unless the authorization is terminated or revoked.     Resp Syncytial Virus by PCR NEGATIVE NEGATIVE Final    Comment: (NOTE) Fact Sheet for Patients: BloggerCourse.com  Fact Sheet for Healthcare Providers: SeriousBroker.it  This test is not yet approved or cleared by the [place] FDA and has been authorized for detection and/or diagnosis of SARS-CoV-2 by FDA under an Emergency Use Authorization (EUA). This EUA will remain in effect (meaning this test can be used) for the duration of the COVID-19 declaration under Section 564(b)(1) of the Act, 21 U.S.C. section 360bbb-3(b)(1), unless the authorization is terminated or revoked.  Performed at [facility], [address], [place], [place] [address]      Labs:  CBC: Recent Labs  Lab 09/13/23 1056 09/14/23 0558 09/15/23 0849 09/16/23 0710  WBC 7.0 6.6 5.2 6.0  HGB 9.0* 7.9* 8.2* 8.3*  HCT 31.6* 27.9* 30.3* 29.6*  MCV 70.1* 69.8* 70.3* 69.8*  PLT 405* 338 395 379   BMP &GFR Recent Labs  Lab 09/13/23 1056 09/14/23 0558 09/15/23 0849 09/16/23 0710  NA 138 137 135 133*  K 4.1  3.3* 3.7 3.6  CL 109 109 110 110  CO2 21* 21* 22 18*  GLUCOSE 96 87 87 85  BUN 9 11 6 11   CREATININE 0.61 0.70 0.68 0.67  CALCIUM 9.6 8.7* 8.6* 8.1*  MG  --   --  2.2 2.3  PHOS  --   --  3.7 3.8   Estimated Creatinine Clearance: 84.1 mL/min (by C-G formula based on SCr of 0.67 mg/dL). Liver & Pancreas: Recent Labs  Lab 09/14/23 0558 09/15/23 0849 09/16/23 0710  AST 14*  --   --   ALT 8  --   --   ALKPHOS 41  --   --   BILITOT 0.4  --   --   PROT 6.2*  --   --   ALBUMIN 3.2* 3.4* 3.3*   No results for input(s): "LIPASE", "AMYLASE" in the last 168 hours. No results for input(s): "AMMONIA" in the last 168 hours. Diabetic: Recent Labs    09/14/23 0558  HGBA1C 5.3  Recent Labs  Lab 09/13/23 1229  GLUCAP 66*   Cardiac Enzymes: No results for input(s): "CKTOTAL", "CKMB", "CKMBINDEX", "TROPONINI" in the last 168 hours. No results for input(s): "PROBNP" in the last 8760 hours. Coagulation Profile: Recent Labs  Lab 09/13/23 1653  INR 1.0   Thyroid Function Tests: No results for input(s): "TSH", "T4TOTAL", "FREET4", "T3FREE", "THYROIDAB" in the last 72 hours. Lipid Profile: Recent Labs    09/14/23 0558  CHOL 176  HDL 31*  LDLCALC 110*  TRIG 174*  CHOLHDL 5.7   Anemia Panel: Recent Labs    09/14/23 0558  VITAMINB12 286  FOLATE 15.0  FERRITIN 2*  TIBC 532*  IRON 15*  RETICCTPCT 1.9   Urine analysis:    Component Value Date/Time   COLORURINE YELLOW 09/13/2023 1056   APPEARANCEUR HAZY (A) 09/13/2023 1056   LABSPEC 1.007 09/13/2023 1056   PHURINE 6.0 09/13/2023 1056   GLUCOSEU NEGATIVE 09/13/2023 1056   HGBUR NEGATIVE 09/13/2023 1056   BILIRUBINUR NEGATIVE 09/13/2023 1056   KETONESUR NEGATIVE 09/13/2023 1056   PROTEINUR NEGATIVE 09/13/2023 1056   UROBILINOGEN 0.2 01/31/2010 1411   NITRITE NEGATIVE 09/13/2023 1056   LEUKOCYTESUR NEGATIVE 09/13/2023 1056   Sepsis Labs: Invalid input(s): "PROCALCITONIN", "LACTICIDVEN"   SIGNED:  Almon Hercules,  MD  [facility] 09/16/2023, 1:07 PM

## 2023-09-16 NOTE — TOC Transition Note (Signed)
 Transition of Care [facility]) - Discharge Note   Patient Details  Name: Kayla Strickland MRN: [id number] Date of Birth: 05/20/1976  [facility] CM/SW Contact:  Kermit Balo, RN Phone Number: 09/16/2023, 4:18 PM   Clinical Narrative:     Pt is discharging home with outpatient therapy through [facility]. Information on the AVS.  Pt has transportation home.  Final next level of care: OP Rehab Barriers to Discharge: No Barriers Identified   Patient Goals and CMS Choice     Choice offered to / list presented to : Patient      Discharge Placement                       Discharge Plan and Services Additional resources added to the After Visit Summary for     Discharge Planning Services: CM Consult                                 Social Drivers of Health (SDOH) Interventions SDOH Screenings   Food Insecurity: No Food Insecurity (09/13/2023)  Housing: Low Risk  (09/13/2023)  Transportation Needs: No Transportation Needs (09/13/2023)  Utilities: Not At Risk (09/13/2023)  Tobacco Use: Low Risk  (09/13/2023)     Readmission Risk Interventions     No data to display

## 2023-09-16 NOTE — Progress Notes (Signed)
 VASCULAR LAB    TCD bubble study has been performed.  See CV proc for preliminary results.   Ailynn Gow, RVT 09/16/2023, 3:12 PM

## 2023-09-16 NOTE — Plan of Care (Signed)
 Discharge instructions discussed with patient.  Patient instructed on home medications, restrictions, and follow up appointments. Belongings gathered and sent with patient.  Patients medications to be picked up at [facility] pharmacy.

## 2023-09-20 NOTE — Telephone Encounter (Signed)
 Scheduled the patient for PFO consult with Dr. Lynnette Caffey 09/22/2023. She was grateful for call and agreed with plan.

## 2023-09-20 NOTE — Progress Notes (Unsigned)
 Cardiology Office Note:   Date:  09/22/2023  ID:  Kayla Strickland, DOB 02-Jan-1976, MRN [id number] PCP:  Aviva Kluver  [facility] Providers Cardiologist:  Alverda Skeans, MD Referring MD: No ref. provider found  Chief Complaint/Reason for Referral: Possible PFO in context of stroke ASSESSMENT:    1. Acute CVA (cerebrovascular accident) (HCC)   2. Vertebral artery dissection (HCC)   3. Hyperlipidemia LDL goal <55   4. BMI 25.0-25.9,adult     PLAN:   In order of problems listed above: Acute CVA: Given the fact she had a vertebral artery dissection and this perfuses the cerebellum I would think that this is probably the cause of her stroke and the PFO is incidental despite an elevated rope score.  Continue aspirin 81 mg and Plavix 75 mg.  The patient will be seeing neurology in follow-up and further workup for PFO closure could be pursued as informed by their recommendations.  I will refer the patient for a monitor to exclude atrial arrhythmias completely. Vertebral artery dissection: See discussion above.  This is likely the cause of the patient's CVA.  The PFO is likely incidental. Hyperlipidemia: Continue atorvastatin 40 mg; goal LDL is less than 55.   Elevated BMI: Diet and exercise modification.  Hemoglobin A1c earlier this month was 5.3.            Dispo:  Return if symptoms worsen or fail to improve.      Medication Adjustments/Labs and Tests Ordered: Current medicines are reviewed at length with the patient today.  Concerns regarding medicines are outlined above.  The following changes have been made:  no change   Labs/tests ordered: Orders Placed This Encounter  Procedures   LONG TERM MONITOR (3-14 DAYS)    Medication Changes: No orders of the defined types were placed in this encounter.   Current medicines are reviewed at length with the patient today.  The patient does not have concerns regarding medicines.     History of Present Illness:      FOCUSED  PROBLEM LIST:   Acute vertigo/CVA March 2025 MRI acute infarction inferomedial right cerebellar hemisphere CTA >> right vertebral artery dissection CT head small cerebellar infarction without hemorrhagic transformation TCD positive for small PFO with Valsalva TTE normal LV function; no bubble study performed No evidence of atrial fibrillation on all EKGs reviewed Rope score 8 Hyperlipidemia BMI 05 October 2023:  Patient consents to use of AI scribe. The patient is a 48 year old female with the above listed medical problems here for recommendations regarding positive TCD in the context of stroke.  The patient was in her normal state of health up until March 2025 when she developed vertigo.  She presented to the emergency department and was diagnosed with a right cerebellar infarction.  Her workup was notable for CTA which demonstrated vertebral artery dissection on the right side.  TCD was positive for a small PFO with Valsalva.    She experienced a sudden onset of extreme dizziness described as 'dizziness that was in circles' and strong headaches, particularly on the right side of her head. This began on a Saturday, worsened on Sunday, and by Monday, she had difficulty controlling her right side, accompanied by a very painful headache on the right side. She was hospitalized and diagnosed with a vertebral artery dissection. Since discharge from the hospital on March 6th, she feels better overall, though she occasionally experiences dizziness, particularly when in an elevator. She describes feeling like she has 'bowels in my  feet' and tends to veer to the right side. She has not yet followed up with neurology as an outpatient.  She reports episodes of palpitations, which began about a year ago during a period of high stress while working as a [employer]. She describes her heart beating faster and experiencing 'little tiny pain' even when not stressed, though these episodes are less frequent  now. She has since changed jobs to reduce stress.          Current Medications: Current Meds  Medication Sig   acetaminophen (TYLENOL) 325 MG tablet Take 2 tablets (650 mg total) by mouth every 4 (four) hours as needed for mild pain (pain score 1-3) (or temp > 37.5 C (99.5 F)).   aspirin EC 81 MG tablet Take 1 tablet (81 mg total) by mouth daily. Swallow whole.   atorvastatin (LIPITOR) 40 MG tablet Take 1 tablet (40 mg total) by mouth daily.   clopidogrel (PLAVIX) 75 MG tablet Take 1 tablet (75 mg total) by mouth daily.   ferrous sulfate 325 (65 FE) MG tablet Take 1 tablet (325 mg total) by mouth 2 (two) times daily with a meal.   senna-docusate (SENOKOT-S) 8.6-50 MG tablet Take 1 tablet by mouth 2 (two) times daily between meals as needed for mild constipation.     Review of Systems:   Please see the history of present illness.    All other systems reviewed and are negative.     EKGs/Labs/Other Test Reviewed:   EKG: EKG from September 14, 2023 demonstrates normal sinus rhythm  EKG Interpretation Date/Time:    Ventricular Rate:    PR Interval:    QRS Duration:    QT Interval:    QTC Calculation:   R Axis:      Text Interpretation:           Risk Assessment/Calculations:          Physical Exam:   VS:  BP 104/62   Pulse 70   Ht 5\' 4"  (1.626 m)   Wt 152 lb (68.9 kg)   SpO2 96%   BMI 26.09 kg/m        Wt Readings from Last 3 Encounters:  09/22/23 152 lb (68.9 kg)  09/13/23 150 lb (68 kg)  08/01/20 146 lb 9.6 oz (66.5 kg)      GENERAL:  No apparent distress, AOx3 HEENT:  No carotid bruits, +2 carotid impulses, no scleral icterus CAR: RRR  no murmurs, gallops, rubs, or thrills RES:  Clear to auscultation bilaterally ABD:  Soft, nontender, nondistended, positive bowel sounds x 4 VASC:  +2 radial pulses, +2 carotid pulses NEURO:  CN 2-12 grossly intact; motor and sensory grossly intact PSYCH:  No active depression or anxiety EXT:  No edema, ecchymosis, or  cyanosis  Signed, Orbie Pyo, MD  09/22/2023 5:11 PM    [facility] [address] [place], [place], [place]  [address] Phone: [phone]; Fax: [phone]   Note:  This document was prepared using Dragon voice recognition software and may include unintentional dictation errors.

## 2023-09-22 ENCOUNTER — Encounter: Payer: Self-pay | Admitting: Internal Medicine

## 2023-09-22 ENCOUNTER — Ambulatory Visit: Attending: Internal Medicine | Admitting: Internal Medicine

## 2023-09-22 ENCOUNTER — Ambulatory Visit (INDEPENDENT_AMBULATORY_CARE_PROVIDER_SITE_OTHER)

## 2023-09-22 VITALS — BP 104/62 | HR 70 | Ht 64.0 in | Wt 152.0 lb

## 2023-09-22 DIAGNOSIS — Z6825 Body mass index (BMI) 25.0-25.9, adult: Secondary | ICD-10-CM | POA: Diagnosis not present

## 2023-09-22 DIAGNOSIS — I7774 Dissection of vertebral artery: Secondary | ICD-10-CM

## 2023-09-22 DIAGNOSIS — E785 Hyperlipidemia, unspecified: Secondary | ICD-10-CM | POA: Diagnosis not present

## 2023-09-22 DIAGNOSIS — I639 Cerebral infarction, unspecified: Secondary | ICD-10-CM | POA: Diagnosis not present

## 2023-09-22 NOTE — Patient Instructions (Signed)
 Medication Instructions:  No changes *If you need a refill on your cardiac medications before your next appointment, please call your pharmacy*   Lab Work: none If you have labs (blood work) drawn today and your tests are completely normal, you will receive your results only by: MyChart Message (if you have MyChart) OR A paper copy in the mail If you have any lab test that is abnormal or we need to change your treatment, we will call you to review the results.   Testing/Procedures: 14 day ZIO XT   Follow-Up: As needed  Other Instructions ZIO XT- Long Term Monitor Instructions  Your physician has requested you wear a ZIO patch monitor for 14 days.  This is a single patch monitor. [employer] supplies one patch monitor per enrollment. Additional stickers are not available. Please do not apply patch if you will be having a Nuclear Stress Test,  Echocardiogram, Cardiac CT, MRI, or Chest Xray during the period you would be wearing the  monitor. The patch cannot be worn during these tests. You cannot remove and re-apply the  ZIO XT patch monitor.  Your ZIO patch monitor will be mailed 3 day USPS to your address on file. It may take 3-5 days  to receive your monitor after you have been enrolled.  Once you have received your monitor, please review the enclosed instructions. Your monitor  has already been registered assigning a specific monitor serial # to you.  Billing and Patient Assistance Program Information  We have supplied [employer] with any of your insurance information on file for billing purposes. [employer] offers a sliding scale Patient Assistance Program for patients that do not have  insurance, or whose insurance does not completely cover the cost of the ZIO monitor.  You must apply for the Patient Assistance Program to qualify for this discounted rate.  To apply, please call [employer] at [phone], select option 4, select option 2, ask to apply for  Patient Assistance Program.  Meredeth Ide will ask your household income, and how many people  are in your household. They will quote your out-of-pocket cost based on that information.  [employer] will also be able to set up a 43-month, interest-free payment plan if needed.  Applying the monitor   Shave hair from upper left chest.  Hold abrader disc by orange tab. Rub abrader in 40 strokes over the upper left chest as  indicated in your monitor instructions.  Clean area with 4 enclosed alcohol pads. Let dry.  Apply patch as indicated in monitor instructions. Patch will be placed under collarbone on left  side of chest with arrow pointing upward.  Rub patch adhesive wings for 2 minutes. Remove white label marked "1". Remove the white  label marked "2". Rub patch adhesive wings for 2 additional minutes.  While looking in a mirror, press and release button in center of patch. A small green light will  flash 3-4 times. This will be your only indicator that the monitor has been turned on.  Do not shower for the first 24 hours. You may shower after the first 24 hours.  Press the button if you feel a symptom. You will hear a small click. Record Date, Time and  Symptom in the Patient Logbook.  When you are ready to remove the patch, follow instructions on the last 2 pages of Patient  Logbook. Stick patch monitor onto the last page of Patient Logbook.  Place Patient Logbook in the blue and white box. Use locking tab on box  and tape box closed  securely. The blue and white box has prepaid postage on it. Please place it in the mailbox as  soon as possible. Your physician should have your test results approximately 7 days after the  monitor has been mailed back to [facility].  Call [facility] Customer Care at [phone] if you have questions regarding  your ZIO XT patch monitor. Call them immediately if you see an orange light blinking on your  monitor.  If your monitor falls off in less than 4 days, contact our Monitor  department at [phone].  If your monitor becomes loose or falls off after 4 days call [employer] at [phone] for  suggestions on securing your monitor

## 2023-09-22 NOTE — Progress Notes (Unsigned)
 Applied a 14 day Zio XT monitor to patient in the office ?

## 2023-09-24 ENCOUNTER — Ambulatory Visit: Admitting: Physical Therapy

## 2023-09-24 ENCOUNTER — Ambulatory Visit: Attending: Family Medicine

## 2023-09-24 ENCOUNTER — Other Ambulatory Visit: Payer: Self-pay

## 2023-09-24 ENCOUNTER — Encounter: Payer: Self-pay | Admitting: Physical Therapy

## 2023-09-24 DIAGNOSIS — R2681 Unsteadiness on feet: Secondary | ICD-10-CM | POA: Diagnosis present

## 2023-09-24 DIAGNOSIS — R2689 Other abnormalities of gait and mobility: Secondary | ICD-10-CM | POA: Diagnosis present

## 2023-09-24 DIAGNOSIS — I639 Cerebral infarction, unspecified: Secondary | ICD-10-CM | POA: Insufficient documentation

## 2023-09-24 DIAGNOSIS — R41841 Cognitive communication deficit: Secondary | ICD-10-CM | POA: Insufficient documentation

## 2023-09-24 DIAGNOSIS — M6281 Muscle weakness (generalized): Secondary | ICD-10-CM | POA: Diagnosis present

## 2023-09-24 NOTE — Therapy (Signed)
 OUTPATIENT PHYSICAL THERAPY NEURO EVALUATION   Patient Name: Kayla Strickland MRN: [id number] DOB:[birth year], 48 y.o., female Today's Date: 09/24/2023   PCP: No PCP REFERRING PROVIDER: Rhetta Mura, MD >to follow up with Dr. Pearlean Brownie  END OF SESSION:  PT End of Session - 09/24/23 1230     Visit Number 1    Number of Visits 8    Date for PT Re-Evaluation 11/12/23    Authorization Type [employer] 2025-no auth required, no VL    PT Start Time 1020    PT Stop Time 1105    PT Time Calculation (min) 45 min    Equipment Utilized During Treatment Gait belt    Activity Tolerance Patient tolerated treatment well    Behavior During Therapy WFL for tasks assessed/performed             Past Medical History:  Diagnosis Date   Endometriosis    Medical history non-contributory    Past Surgical History:  Procedure Laterality Date   CESAREAN SECTION  08/22/2011   Procedure: CESAREAN SECTION;  Surgeon: Roseanna Rainbow, MD;  Location: [facility];  Service: Gynecology;  Laterality: N/A;  Primary cesarean section with delivery of baby girl at 60. Apgars 8/9.   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   Cesarean delivery delivered 08/23/2011   Chorioamnionitis, delivered, current hospitalization 08/23/2011   Post-term pregnancy, 40-42 weeks of gestation 08/22/2011    ONSET DATE: 09/13/2023  REFERRING DIAG:  I77.74 (ICD-10-CM) - Vertebral artery dissection (HCC)  I63.9 (ICD-10-CM) - Acute CVA (cerebrovascular accident) (HCC)    THERAPY  DIAG:  Unsteadiness on feet  Muscle weakness (generalized)  Other abnormalities of gait and mobility  Rationale for Evaluation and Treatment: Rehabilitation  SUBJECTIVE:                                                                                                                                                                                             SUBJECTIVE STATEMENT: Had CVA a week and a half ago.  A little weaker on R side and I have always been right handed.  Continue to notice improvement every day.   Pt accompanied by: significant  other  PERTINENT HISTORY: HLD, PFO (likely not the cause of CVA, as pt's CVA was due to vertebral artery dissection).  48 yo female admitted 09/13/2023 with acute R cerebellar infarct with possible R vertebral artery dissection, anemia microcytic hypochromic  PAIN:  Are you having pain? No  Occasional headaches  PRECAUTIONS: Other: No driving, no return to work, no popping of neck until at least 10/07/2023  RED FLAGS: None   WEIGHT BEARING RESTRICTIONS: No  FALLS: Has patient fallen in last 6 months? No  LIVING ENVIRONMENT: Lives with: lives with their family Has daughter, mother, friends helping out Lives in: House/apartment Stairs: flight of steps to bedroom Has following equipment at home: None  PLOF: Independent, Vocation/Vocational requirements: works at [employer], mostly seated job, and Leisure: enjoys walking and being outside  PATIENT GOALS: To do the best for me to do what I can to do better.  OBJECTIVE:  Note: Objective measures were completed at Evaluation unless otherwise noted.  DIAGNOSTIC FINDINGS: Diminished caliber and opacification of the right vertebral artery from the origin to the distal V2 segment. 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  Expected evolution of small right cerebellar infarct  since 09/13/2023.  COGNITION: Overall cognitive status: Within functional limits for tasks assessed   SENSATION: Light touch: Impaired  and little less sensation noted RLE RLE feels heavy COORDINATION: Heel to shin and alternating tapping WFL  MUSCLE TONE: RLE: Mild    LOWER EXTREMITY ROM:   WFL BLEs   LOWER EXTREMITY MMT:    MMT Right Eval Left Eval  Hip flexion 4 5  Hip extension    Hip abduction    Hip adduction    Hip internal rotation    Hip external rotation    Knee flexion 4+ 5  Knee extension 4+ 5  Ankle dorsiflexion 4 5  Ankle plantarflexion    Ankle inversion    Ankle eversion    (Blank rows = not tested)    TRANSFERS: Assistive device utilized: None  Sit to stand: Modified independence Stand to sit: Modified independence  STAIRS: Level of Assistance: Modified independence Stair Negotiation Technique: Alternating Pattern  with Single Rail on Left Number of Stairs: 3  Height of Stairs: 4-6"    GAIT: Gait pattern: step through pattern, decreased ankle dorsiflexion- Right, and poor foot clearance- Right Distance walked: 50 ft Assistive device utilized: None Level of assistance: Modified independence Comments: slowed gait pace, reports fatigue  FUNCTIONAL TESTS:  5 times sit to stand: 18.59 sec arms crossed at chest Timed up and go (TUG): 15.28 sec 10 meter walk test: 13.22 sec (2.48 ft/sec) Functional gait assessment: NT DGI:  15/24 (Scores <19/24 indicate increased fall risk)   [facility] PT Assessment - 09/24/23 1043       Standardized Balance Assessment   Standardized Balance Assessment Dynamic Gait Index      Dynamic Gait Index   Level Surface Mild Impairment    Change in Gait Speed Mild Impairment    Gait with Horizontal Head Turns Moderate Impairment    Gait with Vertical Head Turns Mild Impairment    Gait and Pivot Turn Mild Impairment    Step Over Obstacle Mild Impairment    Step Around Obstacles Mild Impairment    Steps Mild  Impairment    Total Score 15      Functional Gait  Assessment   Gait assessed  --  Vitals:  104/76                                                                                                                        TREATMENT DATE: 09/24/2023    PATIENT EDUCATION: Education details: Eval results, POC to address balance, strength, fall risk per objective measures; initiated HEP; answered questions about fatigue following CVA Person educated: Patient and Spouse Education method: Explanation, Demonstration, and Handouts Education comprehension: verbalized understanding and returned demonstration  HOME EXERCISE PROGRAM: Access Code: [email or web address] URL: [email or web address] Date: 09/24/2023 Prepared by: [facility] - Outpatient  Rehab - [facility]  Exercises - Sit to stand in stride stance  - 1 x daily - 7 x weekly - 3 sets - 5-10 reps - Step Taps on High Step  - 1 x daily - 7 x weekly - 3 sets - 10 reps  GOALS: Goals reviewed with patient? Yes  SHORT TERM GOALS: Target date: 10/15/2023  Pt will be independent with HEP for improved strength, balance, gait. Baseline: Goal status: INITIAL  2.  Pt will improve 5x sit<>stand to less than or equal to 11.5 sec to demonstrate improved functional strength and transfer efficiency. Baseline: 18.59 sec Goal status: INITIAL  3.  Pt will improve TUG score to less than or equal to 13.5 sec for decreased fall risk. Baseline: 15 sec Goal status: INITIAL  LONG TERM GOALS: Target date: 11/12/2023  Pt will be independent with progression of HEP for improved strength, balance, gait. Baseline:  Goal status: INITIAL  2.  Pt will improve gait velocity to at least 3 ft/sec for improved gait efficiency and safety. Baseline: 2.48 ft/sec Goal status: INITIAL  3.  Pt will improve DGI score to at least 19/24 to decrease fall risk. Baseline:  Goal status: INITIAL  4.  Pt will ambulate at least 1000 ft  indoor/outdoor surfaces for improved independent return to community mobility. Baseline:  Goal status: INITIAL  ASSESSMENT:  CLINICAL IMPRESSION: Patient is a 48 y.o. female who was seen today for physical therapy evaluation and treatment for cerebellar CVA and vertebral artery dissection.  She was admitted 09/13/2023 and was discharged home after 3 days.  She reports R sided weakness and less coordination, but notes this has been improving daily.  With objective measures assessed today, pt does present with decreased R strength, decreased balance, decreased timing and coordination of gait, decreased endurance (pt has hx of endemetriosis and is currently having her period, causing significant fatigue).  She is at increased fall risk per FTSTS, TUG, and DGI scores.  Prior to hospitalization, she was independent, working and enjoyed walking outdoors for exercise.  She would benefit from skilled PT to address the above stated deficits to decrease fall risk and to improve overall return to independent PLOF.  OBJECTIVE IMPAIRMENTS: Abnormal gait, decreased balance, decreased endurance, decreased mobility, difficulty walking, and decreased strength.   ACTIVITY LIMITATIONS: transfers, locomotion level, and caring for others  PARTICIPATION LIMITATIONS: meal prep, cleaning, laundry, driving,  shopping, community activity, and yard work  PERSONAL FACTORS: 1-2 comorbidities: see above  are also affecting patient's functional outcome.   REHAB POTENTIAL: Good  CLINICAL DECISION MAKING: Evolving/moderate complexity  EVALUATION COMPLEXITY: Moderate  PLAN:  PT FREQUENCY: 1x/week  PT DURATION: 8 weeks including eval visit  PLANNED INTERVENTIONS: 97110-Therapeutic exercises, 97530- Therapeutic activity, 97112- Neuromuscular re-education, 97535- Self Care, 40981- Manual therapy, [phone]- Gait training, Patient/Family education, Balance training, and Stair training  PLAN FOR NEXT SESSION: Review initial HEP  and progress for RLE NMR, strengthening, balance   Arrow Emmerich W., PT 09/24/2023, 12:31 PM  [facility] at [facility] [address], [address] [place], [place] [address] Phone # [phone] Fax # [phone]

## 2023-09-24 NOTE — Patient Instructions (Signed)
   Constant Therapy app from [employer] -attention/concentration -memory -understanding things people say  [id number]- 15% off subscription

## 2023-09-24 NOTE — Therapy (Signed)
 OUTPATIENT SPEECH LANGUAGE PATHOLOGY EVALUATION   Patient Name: Kayla Strickland MRN: [id number] DOB:06-05-1976, 48 y.o., female Today's Date: 09/24/2023  PCP: No PCP in [facility] at this time REFERRING PROVIDER: Almon Hercules, MD (f/u/ with Lina Sayre, MD on 10/27/23)          Doc going to Lina Sayre, MD  END OF SESSION:  End of Session - 09/24/23 1433     Visit Number 1    Number of Visits 17    Date for SLP Re-Evaluation 11/26/23    SLP Start Time 1105    SLP Stop Time  1150    SLP Time Calculation (min) 45 min    Activity Tolerance Patient limited by pain;Patient tolerated treatment well   HA 5/10 last 10 minutes of session            Past Medical History:  Diagnosis Date   Endometriosis    Medical history non-contributory    Past Surgical History:  Procedure Laterality Date   CESAREAN SECTION  08/22/2011   Procedure: CESAREAN SECTION;  Surgeon: Roseanna Rainbow, MD;  Location: [facility];  Service: Gynecology;  Laterality: N/A;  Primary cesarean section with delivery of baby girl at 43. Apgars 8/9.   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 MRI = possible right cerebellar infarct. CTHead = expected evolution of infarct. PMH: HLD, PFO (likely not the cause of CVA, as pt's CVA was due to vertebral artery dissection), and endometriosis.   PAIN:  Are you having pain? Prior to story retell, no, but after that developed into Yes: NPRS scale: 5/10 Pain location: posterior rt head Pain description: HA Aggravating factors: focusing on intense task Relieving factors: not needing to focus  FALLS: Has patient fallen in last 6 months?  No, See PT evaluation for details  LIVING ENVIRONMENT: Lives with: lives with their family Lives in: House/apartment  PLOF:  Level of assistance: Independent with ADLs, Independent with IADLs Employment: Tour manager  PATIENT GOALS: Improve communication   OBJECTIVE:  Note: Objective measures were completed at Evaluation unless otherwise noted.  DIAGNOSTIC FINDINGS:  MRI 09/14/23 IMPRESSION: 1. Small area of edema or encephalomalacia in the inferior right cerebellum. Findings are concerning for an acute infarct given findings on same day CTA and reported symptoms; however, unfortunately this cannot be confirmed given this area is obscured on diffusion-weighted imaging by artifact from the patient's braces. 2. Otherwise, no visible acute abnormality on this limited MRI.  Clinical Impression - SLE 09/16/23  Pt presents with mild neurocognitive deficits, specifically attention and recall.  Pt awake and alert, pleasant and cooperative. Pt husband present. Pt is bilingual with Spanish her native language. She has been fluent in [place] for 20+ years She reports independence with driving, household responsibilities, finances and medication prior to admit. She  works in Automotive engineer at [employer] and went to college for 2 years in [place]. They have 2 daughters (10 and 12).  Speech is intelligible. Vocal intensity is mildly decreased, which is new since admit (pt reports headache pain meds given earlier, which may be associated to lower volume). Receptive and Expressive Language appear intact. Pt does report mild difficulty with processing Spanish vs English since admit. The [employer] Mental Status (SLUMS) Examination was administered. Pt scored 20/30, raising concern for mild neurocognitive deficits. Points were lost on orientation to day of week, digit reversal, hand placement on clock drawing, and auditory attention and recall. Additional high level cognitive assessment is recommended through home health or outpatient speech therapy. Pt/spouse receptive to review of results and recommendations. Acute ST signing off.    COGNITION: Overall cognitive status: Impaired Areas of impairment:  Attention: Impaired: Selective, Divided, Comment: Pt provided examples of difficulty focusing with competing noise (now since CVA), and inability to concentrate on a task and answer a question (since CVA)  Memory: Impaired: Short term Executive function: Impaired: Impulse control and Comment: More testing may be necessary. Impulse demonstrated with symbol cancellation, clock drawing, and symbol trails tasks. Functional deficits: Pt described difficulties above in "attention". Additionally husband stated and pt agrees she is having difficulty with short term memory - husband stated he thinks pt's memory is at 90-95% of baseline. Pt, in fact, had more difficulty retelling facts from story recall task but "yes/no" questions were answered without error, indicating possible retrieval deficit as opposed to an encoding deficit.  COGNITIVE COMMUNICATION: Following directions: Follows one step commands inconsistently based on short term memory and attention Auditory  comprehension: Impaired: related to memory and attention Verbal expression: Impaired: related to selective attention Functional communication: Impaired: Pragmatics: Pt appeared as if she was not paying attention at times due to headache  ORAL MOTOR EXAMINATION: Overall status: WFL  STANDARDIZED ASSESSMENTS: CLQT: to be completed in first session  PATIENT REPORTED OUTCOME MEASURES (PROM): Cognitive Function: to be provided during first 1-2 sessions                                                                                                                            TREATMENT DATE:   09/24/23:  Evaluation results, POC addressing attention, memory, and eval to complete next session with other goals added PRN. Pt and husband were educated about common sx cognitive communication deficits following CVA. SLP shared with pt and husband about Constant Therapy, and answered pt questions about other games/activities that are helpful in cognitive rehab. SLP told pt/husband that x2/week ST was recommended over x1/week ST - pt to check with  friend to see if she can drive pt Z6/[id number] and husband would take pt x1/week. Lastly SLP educated pt and husband about need for pt to take breaks during focused tasks when HA too severe, based upon pt performance today (developed HA 5/10 pain after focused work on evaluation task).  PATIENT EDUCATION: Education details: see "treatment date" above  Person educated: Patient and Spouse Education method: Explanation, Demonstration, and Handouts Education comprehension: verbalized understanding and needs further education   GOALS: Goals reviewed with patient? Yes, generally  SHORT TERM GOALS: Target date: 10/29/23 (due to scheduling)  Pt will use one memory strategy in or between 3 sessions Baseline: Goal status: INITIAL  2.  Pt will incr selective attention skills in functional linguistic tasks for 10 minutes in mod noisy environment x2 sessions Baseline:  Goal  status: INITIAL  3.  Pt will successfully use trained compensations for attention and mental fatigue between 3 sessions prior to 10/29/23 Baseline:  Goal status: INITIAL  4.  Pt will complete detailed linguistic tasks involving a computer and/or phone 90% accuracy using self correction x3 sessions Baseline:  Goal status: INITIAL    LONG TERM GOALS: Target date: 11/26/23  Pt will improve PROM score compared to initial administration Baseline:  Goal status: INITIAL  2.  Pt will successfully use two memory strategy in or between 3 sessions Baseline:  Goal status: INITIAL  3.  Pt will incr selective attention skills in functional linguistic tasks for 20 minutes in mod noisy environment x2 sessions Baseline:  Goal status: INITIAL  4.  Pt will successfully use trained compensations for attention and mental fatigue between 3 sessions after 10/29/23 Baseline:  Goal status: INITIAL  5.  Pt will complete detailed linguistic tasks involving a computer and/or phone 100% accuracy using self correction x3 sessions Baseline:  Goal status: INITIAL   ASSESSMENT:  CLINICAL IMPRESSION: Patient is a 48 y.o. F who was seen today for assessment of cognitive communication skills following CVA in early March 2025. With CLQT today she demonstrated deficits in memory, and attention, and possibly impulsive behavior. CLQT will be completed next session and further goals added PRN. See "Cognition" above for more details about how these deficits are impacting her daily life.   OBJECTIVE IMPAIRMENTS: include attention, memory, expressive language, and receptive language. These impairments are limiting patient from return to work, household responsibilities, ADLs/IADLs, and effectively communicating at home and in community. Factors affecting potential to achieve goals and functional outcome are pain level and possible transportation limitation affecting frequency of ST .Marland Kitchen Patient will benefit from skilled SLP  services to address above impairments and improve overall function.  REHAB POTENTIAL: Good, but dependent upon frequency that pt can attend ST  PLAN:  SLP FREQUENCY: 1-2x/week (x2/week recommended but due to pt's transportation may require x1/week)  SLP DURATION: 8 weeks  PLANNED INTERVENTIONS: Environmental controls, Cueing hierachy, Cognitive reorganization, Internal/external aids, Functional tasks, SLP instruction and feedback, Compensatory strategies, Patient/family education, and [id number] Treatment of speech (30 or 45 min)     Reyann Troop, CCC-SLP 09/24/2023, 2:36 PM

## 2023-10-04 NOTE — Therapy (Signed)
 OUTPATIENT PHYSICAL THERAPY NEURO TREATMENT   Patient Name: Kayla Strickland MRN: [id number] DOB:[birth year], 48 y.o., female Today's Date: 10/05/2023   PCP: No PCP REFERRING PROVIDER: Rhetta Mura, MD >to follow up with Dr. Pearlean Brownie  END OF SESSION:  PT End of Session - 10/05/23 1706     Visit Number 2    Number of Visits 8    Date for PT Re-Evaluation 11/12/23    Authorization Type [employer] 2025-no auth required, no VL    PT Start Time 1618    PT Stop Time 1704    PT Time Calculation (min) 46 min    Equipment Utilized During Treatment Gait belt    Activity Tolerance Patient tolerated treatment well    Behavior During Therapy WFL for tasks assessed/performed              Past Medical History:  Diagnosis Date   Endometriosis    Medical history non-contributory    Past Surgical History:  Procedure Laterality Date   CESAREAN SECTION  08/22/2011   Procedure: CESAREAN SECTION;  Surgeon: Roseanna Rainbow, MD;  Location: [facility];  Service: Gynecology;  Laterality: N/A;  Primary cesarean section with delivery of baby girl at 64. Apgars 8/9.   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   Cesarean delivery delivered 08/23/2011   Chorioamnionitis, delivered, current hospitalization 08/23/2011   Post-term pregnancy, 40-42 weeks of gestation 08/22/2011    ONSET DATE: 09/13/2023  REFERRING DIAG:  I77.74 (ICD-10-CM) - Vertebral artery dissection (HCC)  I63.9 (ICD-10-CM) - Acute CVA (cerebrovascular accident) (HCC)     THERAPY DIAG:  Unsteadiness on feet  Muscle weakness (generalized)  Other abnormalities of gait and mobility  Rationale for Evaluation and Treatment: Rehabilitation  SUBJECTIVE:                                                                                                                                                                                             SUBJECTIVE STATEMENT: Having fatigue and HA's on her R side. Reports that she tried to walk a little further on a busier road and it gave her a HA. Reports that she is scheduled to return  to work on 10/07/23 but she does not feel physically ready. Is not scheduled to see PCP until May.   Pt accompanied by: mother, daughter   PERTINENT HISTORY: HLD, PFO (likely not the cause of CVA, as pt's CVA was due to vertebral artery dissection).  48 yo female admitted 09/13/2023 with acute R cerebellar infarct with possible R vertebral artery dissection, anemia microcytic hypochromic  PAIN:  Are you having pain? No  Occasional headaches  PRECAUTIONS: Other: No driving, no return to work, no popping of neck until at least 10/07/2023  RED FLAGS: None   WEIGHT BEARING RESTRICTIONS: No  FALLS: Has patient fallen in last 6 months? No  LIVING ENVIRONMENT: Lives with: lives with their family Has daughter, mother, friends helping out Lives in: House/apartment Stairs: flight of steps to bedroom Has following equipment at home: None  PLOF: Independent, Vocation/Vocational requirements: works at [employer], mostly seated job, and Leisure: enjoys walking and being outside  PATIENT GOALS: To do the best for me to do what I can to do better.  OBJECTIVE:       TODAY'S TREATMENT: 10/05/23 Activity Comments  Review of initial HEP: STS in stride stance 10x  step taps on high step x20 Good form. C/o fatigue with STS  R step downs with heel touch 2x10 4", 2" 1 fingertip support   fwd/back stepping fwd/back stepping + head nods   Occasiounal CGA d/t imbalance; 1 fingertip support with good safety  toe raises off edge of incline with eccentric lower Good DF lift  Walking on heels/toes Decreased amplitude R DF Pt reports R sided neck pain (reports this is improved with self-massage)         HOME EXERCISE PROGRAM Last updated: 10/05/23  Access Code: [email or web address] URL: [email or web address] Date: 10/05/2023 Prepared by: [facility] - Outpatient  Rehab - [facility]  Exercises - Sit to stand in stride stance  - 1 x daily - 7 x weekly - 3 sets - 5-10 reps - Step Taps on High Step  - 1 x daily - 7 x weekly - 3 sets - 10 reps - Forward Step Down with Heel Tap and Counter Support  - 1 x daily - 5 x weekly - 2 sets - 10 reps - Alternating Step Forward with Support  - 1 x daily - 5 x weekly - 2 sets - 10 reps - Heel Walking with Counter Support  - 1 x daily - 5 x weekly - 2 sets - 3-5 reps   PATIENT EDUCATION: Education details: answered pt's questions about extension of medical leave- advised that MD would have to adjust medical leave. Encouraged pt to call PCP's office; HEP update, edu on stroke recovery and need for a balance between rest and activity  Person educated: Patient Education method: Explanation, Demonstration, Tactile cues, Verbal cues, and Handouts Education comprehension: verbalized understanding and returned demonstration    Note: Objective measures were completed at Evaluation unless otherwise noted.  DIAGNOSTIC FINDINGS: Diminished caliber and opacification of the right vertebral artery from the origin to the distal V2 segment. 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  Expected evolution of small right cerebellar infarct since 09/13/2023.  COGNITION: Overall cognitive status: Within functional limits for tasks assessed   SENSATION: Light touch: Impaired  and little less  sensation noted RLE RLE feels heavy COORDINATION: Heel to shin and alternating tapping WFL  MUSCLE TONE: RLE: Mild  LOWER EXTREMITY ROM:   WFL BLEs   LOWER EXTREMITY MMT:    MMT Right Eval Left Eval  Hip flexion 4 5  Hip extension    Hip abduction    Hip adduction    Hip internal rotation    Hip external rotation    Knee flexion 4+ 5  Knee extension 4+ 5  Ankle dorsiflexion 4 5  Ankle plantarflexion    Ankle inversion    Ankle eversion    (Blank rows = not tested)    TRANSFERS: Assistive device utilized: None  Sit to stand: Modified independence Stand to sit: Modified independence  STAIRS: Level of Assistance: Modified independence Stair Negotiation Technique: Alternating Pattern  with Single Rail on Left Number of Stairs: 3  Height of Stairs: 4-6"    GAIT: Gait pattern: step through pattern, decreased ankle dorsiflexion- Right, and poor foot clearance- Right Distance walked: 50 ft Assistive device utilized: None Level of assistance: Modified independence Comments: slowed gait pace, reports fatigue  FUNCTIONAL TESTS:  5 times sit to stand: 18.59 sec arms crossed at chest Timed up and go (TUG): 15.28 sec 10 meter walk test: 13.22 sec (2.48 ft/sec) Functional gait assessment: NT DGI:  15/24 (Scores <19/24 indicate increased fall risk)        Vitals:  104/76                                                                                                                        TREATMENT DATE: 09/24/2023    PATIENT EDUCATION: Education details: Eval results, POC to address balance, strength, fall risk per objective measures; initiated HEP; answered questions about fatigue following CVA Person educated: Patient and Spouse Education method: Explanation, Demonstration, and Handouts Education comprehension: verbalized understanding and returned demonstration  HOME EXERCISE PROGRAM: Access Code: [email or web address] URL: [email or web address] Date:  09/24/2023 Prepared by: [facility] - Outpatient  Rehab - [facility]  Exercises - Sit to stand in stride stance  - 1 x daily - 7 x weekly - 3 sets - 5-10 reps - Step Taps on High Step  - 1 x daily - 7 x weekly - 3 sets - 10 reps  GOALS: Goals reviewed with patient? Yes  SHORT TERM GOALS: Target date: 10/15/2023  Pt will be independent with HEP for improved strength, balance, gait. Baseline: Goal status: IN PROGRESS  2.  Pt will improve 5x sit<>stand to less than or equal to 11.5 sec to demonstrate improved functional strength and transfer efficiency. Baseline: 18.59 sec Goal status: IN PROGRESS  3.  Pt will improve TUG score to less than or equal to 13.5 sec for decreased fall risk. Baseline: 15 sec Goal status: IN PROGRESS  LONG TERM GOALS: Target date: 11/12/2023  Pt will be independent with progression of HEP for improved strength, balance, gait. Baseline:  Goal status: IN PROGRESS  2.  Pt will improve gait velocity to at least 3 ft/sec for improved gait efficiency and safety. Baseline: 2.48 ft/sec Goal status: IN  PROGRESS  3.  Pt will improve DGI score to at least 19/24 to decrease fall risk. Baseline:  Goal status: IN PROGRESS  4.  Pt will ambulate at least 1000 ft indoor/outdoor surfaces for improved independent return to community mobility. Baseline:  Goal status: IN PROGRESS  ASSESSMENT:  CLINICAL IMPRESSION: Patient arrived to session with concern about scheduled return to work d/t c/o remaining HA's and fatigue. Review of HEP revealed good tolerance, however patient does quickly fatigue with STS. Progression of R sided LE strengthening/stability exercises revealed more difficulty on affected side. Patient became somewhat emotional/overwhelmed with balance activities d/t feeling surprised at amount of difficulty she experienced with today's activities. Reports R sided neck pain when she becomes overwhelmed stressed and reports that massage relives this.  Patient  reported understanding of HEP update and edu and without complaints upon leaving.   OBJECTIVE IMPAIRMENTS: Abnormal gait, decreased balance, decreased endurance, decreased mobility, difficulty walking, and decreased strength.   ACTIVITY LIMITATIONS: transfers, locomotion level, and caring for others  PARTICIPATION LIMITATIONS: meal prep, cleaning, laundry, driving, shopping, community activity, and yard work  PERSONAL FACTORS: 1-2 comorbidities: see above  are also affecting patient's functional outcome.   REHAB POTENTIAL: Good  CLINICAL DECISION MAKING: Evolving/moderate complexity  EVALUATION COMPLEXITY: Moderate  PLAN:  PT FREQUENCY: 1x/week  PT DURATION: 8 weeks including eval visit  PLANNED INTERVENTIONS: 97110-Therapeutic exercises, 97530- Therapeutic activity, 97112- Neuromuscular re-education, 97535- Self Care, 65784- Manual therapy, [phone]- Gait training, Patient/Family education, Balance training, and Stair training  PLAN FOR NEXT SESSION: Review  HEP update and progress for RLE NMR, strengthening, balance   Baldemar Friday, PT, DPT 10/05/23 5:10 PM  [facility] at [facility] [address], [address] [place], [place] [address] Phone # [phone] Fax # [phone]

## 2023-10-05 ENCOUNTER — Encounter: Payer: Self-pay | Admitting: Physical Therapy

## 2023-10-05 ENCOUNTER — Telehealth: Payer: Self-pay | Admitting: Physical Therapy

## 2023-10-05 ENCOUNTER — Ambulatory Visit: Admitting: Physical Therapy

## 2023-10-05 DIAGNOSIS — R41841 Cognitive communication deficit: Secondary | ICD-10-CM | POA: Diagnosis not present

## 2023-10-05 DIAGNOSIS — R2689 Other abnormalities of gait and mobility: Secondary | ICD-10-CM

## 2023-10-05 DIAGNOSIS — M6281 Muscle weakness (generalized): Secondary | ICD-10-CM

## 2023-10-05 DIAGNOSIS — R2681 Unsteadiness on feet: Secondary | ICD-10-CM

## 2023-10-05 NOTE — Telephone Encounter (Signed)
 Hi Dr. [place],  I saw Ms. Kreischer in [place] s/p CVA. You are scheduled to see her 11/15/23. The patient expressed concerns to me about returning to work s/p stroke as scheduled on 10/07/23. She has only been seen in therapy since 09/24/23, thus is only at the beginning of her POC. I wanted to know if there was a way for her to have a hospital f/u with you or one of your other providers to discuss medical leave?  Thanks,  Baldemar Friday, PT, DPT 10/05/23 5:13 PM  [facility] at [facility] [address] [place], [address] [place], [place] [address] Phone # [phone] Fax # [phone]

## 2023-10-06 ENCOUNTER — Telehealth: Payer: Self-pay

## 2023-10-06 NOTE — Telephone Encounter (Signed)
 Okay to move her new patient appt up to next week

## 2023-10-06 NOTE — Telephone Encounter (Signed)
 Lmom for pt callback to rsc appt to next wk

## 2023-10-07 ENCOUNTER — Ambulatory Visit

## 2023-10-07 DIAGNOSIS — R41841 Cognitive communication deficit: Secondary | ICD-10-CM | POA: Diagnosis not present

## 2023-10-07 NOTE — Therapy (Signed)
 OUTPATIENT SPEECH LANGUAGE PATHOLOGY TREATMENT   Patient Name: Kayla Strickland MRN: [id number] DOB:[birth year], 48 y.o., female Today's Date: 10/07/2023  PCP: No PCP in [facility] at this time REFERRING PROVIDER: Almon Hercules, MD (f/u/ with Lina Sayre, MD on 10/27/23)          Doc going to Lina Sayre, MD  END OF SESSION:  End of Session - 10/07/23 1745     Visit Number 2    Number of Visits 17    Date for SLP Re-Evaluation 11/26/23    SLP Start Time 1623    SLP Stop Time  1705    SLP Time Calculation (min) 42 min    Activity Tolerance Other (comment)   /pain increasd from 3/10 to 7/10 by end of session             Past Medical History:  Diagnosis Date   Endometriosis    Medical history non-contributory    Past Surgical History:  Procedure Laterality Date   CESAREAN SECTION  08/22/2011   Procedure: CESAREAN SECTION;  Surgeon: Roseanna Rainbow, MD;  Location: [facility];  Service: Gynecology;  Laterality: N/A;  Primary cesarean section with delivery of baby girl at 61. Apgars 8/9.   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 MRI = possible right cerebellar infarct. CTHead = expected evolution of infarct. PMH: HLD, PFO (likely not the cause of CVA, as pt's CVA was due to vertebral artery dissection), and endometriosis.   PAIN:  Are you having pain?  Yes: NPRS scale: 3/10 Pain location: posterior rt head-radiating down front of rt neck into rt chest Pain description: HA Aggravating factors: focusing on intense task Relieving factors: not needing to focus  FALLS: Has patient fallen in last 6 months?  No, See PT evaluation for details  LIVING ENVIRONMENT: Lives with: lives with their family Lives in: House/apartment  PLOF:  Level of assistance: Independent with ADLs, Independent with IADLs Employment: Tour manager  PATIENT GOALS: Improve communication   OBJECTIVE:  Note: Objective measures were completed at Evaluation unless otherwise noted.  DIAGNOSTIC FINDINGS:  MRI 09/14/23 IMPRESSION: 1. Small area of edema or encephalomalacia in the inferior right cerebellum. Findings are concerning for an acute infarct given findings on same day CTA and reported symptoms; however, unfortunately this cannot be confirmed given this area is obscured on diffusion-weighted imaging by artifact from the patient's braces. 2. Otherwise, no visible acute abnormality on this limited MRI.  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  Pt awake and alert, pleasant and cooperative. Pt husband present. Pt is bilingual with Spanish her native language. She has been fluent in [place] for 20+ years She reports independence with driving, household responsibilities, finances and medication prior to admit. 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 They have 2 daughters (10 and 12).  Speech is intelligible. Vocal intensity is mildly decreased, which is new since admit (pt reports headache pain meds given earlier, which may be associated to lower volume). Receptive and Expressive Language appear intact. Pt does report mild difficulty with processing Spanish vs English since admit. The [employer] Mental Status (SLUMS) Examination was administered. Pt scored 20/30, raising concern for mild neurocognitive deficits. Points were lost on orientation to day of week, digit reversal, hand placement on clock drawing, and auditory attention and recall. Additional high level cognitive assessment is recommended through home health or outpatient speech therapy. Pt/spouse receptive to review of results and recommendations. Acute ST signing off.    COGNITION: Overall cognitive status: Impaired Areas of impairment:  Attention: Impaired: Selective, Divided, Comment: Pt provided examples of difficulty focusing with competing noise (now since CVA), and inability to concentrate on a task and answer a question (since CVA)  Memory: Impaired: Short term Executive function: Impaired: Impulse control and Comment: More testing may be necessary. Impulse demonstrated with symbol cancellation, clock drawing, and symbol trails tasks. Functional deficits: Pt described difficulties above in "attention". Additionally husband stated and pt agrees she is having difficulty with short term memory - husband stated he thinks pt's memory is at 90-95% of baseline. Pt, in fact, had more difficulty retelling facts from story recall task but "yes/no" questions were answered without error, indicating possible retrieval deficit as opposed to an encoding deficit.  COGNITIVE COMMUNICATION: Following directions: Follows one step commands inconsistently based on short term memory and attention Auditory comprehension: Impaired: related to memory and attention Verbal expression:  Impaired: related to selective attention Functional communication: Impaired: Pragmatics: Pt appeared as if she was not paying attention at times due to headache   STANDARDIZED ASSESSMENTS: Cognitive Linguistic Quick Test: AGE - 18 - 69   The Cognitive Linguistic Quick Test (CLQT) was administered to assess the relative status of five cognitive domains: attention, memory, language, executive functioning, and visuospatial skills. Scores from 10 tasks were used to estimate severity ratings (standardized for age groups 18-69 years and 70-89 years) for each domain, a clock drawing task, as well as an overall composite severity rating of cognition.     Task Score Criterion Cut Scores  Personal Facts 8/8 8  Symbol Cancellation 12/12 11  Confrontation Naming 10/10 10  Clock Drawing  12/13 12  Story Retelling 5/10 6  Symbol Trails 10/10 9  Generative Naming 7/9 5  Design Memory 6/6 5  Mazes  8/8 7  Design Generation 6/13 6   Cognitive Domain Composite Score Severity Rating  Attention 198/215 WNL  Memory 153/185 Mild  Executive Function 31/40 WNL  Language 30/37 WNL  Visuospatial Skills 98/105 WNL  Clock Drawing  12/13 WNL  Composite Severity Rating  WNL   Pt demonstrated difficulty with generative naming after 30 seconds (17 in first 30 seconds, 3 in last 30 seconds (one repeat)), story retell, and design generation. These suggest attention deficits may be pt's most debilitating sx at this time.      PATIENT REPORTED OUTCOME MEASURES (PROM): Cognitive Function: to  be provided during first 1-2 sessions                                                                                                                            TREATMENT DATE:   10/07/23: Needs cognitive PROM next session. Pt is taking breaks when she feels HA or over-stimulated- "I remember what you said last time, take breaks." Pt gets HA when needing to focus; this occurred a few times each day. CLQT completed today and  results above. Performance on CLQT suggests pt has difficulties resulting from decr'd attention skills and memory. SLP also discussed compensations for pt doing things with her daughters that would be quieter in nature to minimize HA and mental fatigue due to pt-reported selective attention deficits in visually or auditory stimulating environments.  09/24/23:  Evaluation results, POC addressing attention, memory, and eval to complete next session with other goals added PRN. Pt and husband were educated about common sx cognitive communication deficits following CVA. SLP shared with pt and husband about Constant Therapy, and answered pt questions about other games/activities that are helpful in cognitive rehab. SLP told pt/husband that x2/week ST was recommended over x1/week ST - pt to check with friend to see if she can drive pt [id number] and husband would take pt x1/week. Lastly SLP educated pt and husband about need for pt to take breaks during focused tasks when HA too severe, based upon pt performance today (developed HA 5/10 pain after focused work on evaluation task).  PATIENT EDUCATION: Education details: see "treatment date" above  Person educated: Patient and Spouse Education method: Explanation, Demonstration, and Handouts Education comprehension: verbalized understanding and needs further education   GOALS: Goals reviewed with patient? Yes, generally  SHORT TERM GOALS: Target date: 10/29/23 (due to scheduling)  Pt will use one memory strategy in or between 3 sessions Baseline: Goal status: INITIAL  2.  Pt will incr selective attention skills in functional linguistic tasks for 10 minutes in mod noisy environment x2 sessions Baseline:  Goal status: INITIAL  3.  Pt will successfully use trained compensations for attention and mental fatigue between 3 sessions prior to 10/29/23 Baseline:  Goal status: INITIAL  4.  Pt will complete detailed linguistic tasks involving a computer and/or  phone 90% accuracy using self correction x3 sessions Baseline:  Goal status: INITIAL    LONG TERM GOALS: Target date: 11/26/23  Pt will improve PROM score compared to initial administration Baseline:  Goal status: INITIAL  2.  Pt will successfully use two memory strategy in or between 3 sessions Baseline:  Goal status: INITIAL  3.  Pt will incr selective attention skills in functional linguistic tasks for 20 minutes in mod noisy environment x2 sessions Baseline:  Goal status: INITIAL  4.  Pt will successfully use trained compensations for attention and mental fatigue between 3 sessions after 10/29/23 Baseline:  Goal status: INITIAL  5.  Pt will complete detailed linguistic tasks involving a computer and/or  phone 100% accuracy using self correction x3 sessions Baseline:  Goal status: INITIAL   ASSESSMENT:  CLINICAL IMPRESSION: Patient is a 48 y.o. F who was seen today for assessment of cognitive communication skills following CVA in early March 2025. Pt cont to demonstrated deficits in memory, and attention. CLQT completed today. See "Cognition" above for more details about how these deficits are impacting her daily life.   OBJECTIVE IMPAIRMENTS: include attention, memory, expressive language, and receptive language. These impairments are limiting patient from return to work, household responsibilities, ADLs/IADLs, and effectively communicating at home and in community. Factors affecting potential to achieve goals and functional outcome are pain level and possible transportation limitation affecting frequency of ST .Marland Kitchen Patient will benefit from skilled SLP services to address above impairments and improve overall function.  REHAB POTENTIAL: Good, but dependent upon frequency that pt can attend ST  PLAN:  SLP FREQUENCY: 1-2x/week (x2/week recommended but due to pt's transportation may require x1/week)  SLP DURATION: 8 weeks  PLANNED INTERVENTIONS: Environmental controls,  Cueing hierachy, Cognitive reorganization, Internal/external aids, Functional tasks, SLP instruction and feedback, Compensatory strategies, Patient/family education, and 16[address] Treatment of speech (30 or 45 min)     Clelia Trabucco, CCC-SLP 10/07/2023, 5:46 PM

## 2023-10-12 ENCOUNTER — Ambulatory Visit

## 2023-10-12 ENCOUNTER — Encounter: Payer: Self-pay | Admitting: Physical Therapy

## 2023-10-12 ENCOUNTER — Ambulatory Visit: Attending: Family Medicine | Admitting: Physical Therapy

## 2023-10-12 DIAGNOSIS — R2681 Unsteadiness on feet: Secondary | ICD-10-CM | POA: Diagnosis present

## 2023-10-12 DIAGNOSIS — R2689 Other abnormalities of gait and mobility: Secondary | ICD-10-CM | POA: Insufficient documentation

## 2023-10-12 DIAGNOSIS — M6281 Muscle weakness (generalized): Secondary | ICD-10-CM | POA: Insufficient documentation

## 2023-10-12 DIAGNOSIS — R41841 Cognitive communication deficit: Secondary | ICD-10-CM | POA: Diagnosis present

## 2023-10-12 NOTE — Therapy (Signed)
 OUTPATIENT SPEECH LANGUAGE PATHOLOGY TREATMENT   Patient Name: Kayla Strickland MRN: [id number] DOB:10-Mar-1976, 48 y.o., female Today's Date: 10/12/2023  PCP: No PCP in [facility] at this time REFERRING PROVIDER: Almon Hercules, MD (f/u/ with Kayla Sayre, MD on 10/27/23)          Doc going to Kayla Sayre, MD  END OF SESSION:  End of Session - 10/12/23 1620     Visit Number 3    Number of Visits 17    Date for SLP Re-Evaluation 11/26/23    SLP Start Time 1622   in restroom at 1616   SLP Stop Time  1700    SLP Time Calculation (min) 38 min    Activity Tolerance Patient tolerated treatment well              Past Medical History:  Diagnosis Date   Endometriosis    Medical history non-contributory    Past Surgical History:  Procedure Laterality Date   CESAREAN SECTION  08/22/2011   Procedure: CESAREAN SECTION;  Surgeon: Kayla Rainbow, MD;  Location: [facility];  Service: Gynecology;  Laterality: N/A;  Primary cesarean section with delivery of baby girl at 53. Apgars 8/9.   CESAREAN SECTION N/A 01/24/2013   Procedure: CESAREAN SECTION;  Surgeon: Kayla Cosier, MD;  Location: [facility];  Service: Obstetrics;  Laterality: N/A;  repeat cesarean section with removal of a portion of the Right fallopian tube   LAPAROSCOPIC ENDOMETRIOSIS FULGURATION     OOPHORECTOMY     Patient Active Problem List   Diagnosis Date Noted   Dizziness 09/14/2023   Anemia 09/14/2023   Acute CVA (cerebrovascular accident) (HCC) 09/14/2023   Vertebral artery dissection (HCC) 09/13/2023   Cesarean delivery delivered 08/23/2011   Chorioamnionitis, delivered, current hospitalization 08/23/2011   Post-term pregnancy, 40-42 weeks of gestation 08/22/2011    ONSET DATE: 09/13/23   REFERRING DIAG:  I63.9 (ICD-10-CM) - Acute CVA (cerebrovascular accident) (HCC)    THERAPY DIAG:  Cognitive communication deficit  Rationale for Evaluation and Treatment: Rehabilitation  SUBJECTIVE:   SUBJECTIVE STATEMENT: "My  headache is 4/10." (When performing problem with errors)  Pt accompanied by: self  PERTINENT HISTORY:  47yo female admitted 09/13/23 with vertigo and headache x3 days. MRI = possible right cerebellar infarct. CTHead = expected evolution of infarct. PMH: HLD, PFO (likely not the cause of CVA, as pt's CVA was due to vertebral artery dissection), and endometriosis.   PAIN:  Are you having pain?   See "S"  FALLS: Has patient fallen in last 6 months?  No, See PT evaluation for details   PATIENT GOALS: Improve communication   OBJECTIVE:  Note: Objective measures were completed at Evaluation unless otherwise noted.  DIAGNOSTIC FINDINGS:  MRI 09/14/23 IMPRESSION: 1. Small area of edema or encephalomalacia in the inferior right cerebellum. Findings are concerning for an acute infarct given findings on same day CTA and reported symptoms; however, unfortunately this cannot be confirmed given this area is obscured on diffusion-weighted imaging by artifact from the patient's braces. 2. Otherwise, no visible acute abnormality on this limited MRI.  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  Pt awake and alert, pleasant and cooperative. Pt husband present. Pt is bilingual with Spanish her native language. She has been fluent in [place] for 20+ years She reports independence with driving, household responsibilities, finances and medication prior to admit. 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 They have 2 daughters (10 and 78).  Speech is intelligible. Vocal intensity is mildly decreased, which is new since admit (pt reports headache pain meds given earlier, which may be associated to lower volume). Receptive and Expressive Language appear intact. Pt does report mild difficulty with processing Spanish vs English since admit. The [employer] Mental Status (SLUMS) Examination was  administered. Pt scored 20/30, raising concern for mild neurocognitive deficits. Points were lost on orientation to day of week, digit reversal, hand placement on clock drawing, and auditory attention and recall. Additional high level cognitive assessment is recommended through home health or outpatient speech therapy. Pt/spouse receptive to review of results and recommendations. Acute ST signing off.      PATIENT REPORTED OUTCOME MEASURES (PROM): Cognitive Function: to be provided during first 1-2 sessions                                                                                                                            TREATMENT DATE:   10/12/23: Kayla Strickland needs Cognitive Function PROM next session. Pt enters with homework complete. Accuracy 90%, due to not reading the 2 questions correctly. She corrected these errors and stated her headache began and plateaued at 4/10. She did not stop to take a break during this time. SLP assisted pt as she worked on detailed written tasks. Pt worked without interruption with the door open and min-mod amount of noise. Her error awareness was decr'd in that she did not note 2 errors made on the task. Homework provided. Pt desires to schedule next ST session after her neurologist f/u on 10/27/23.   10/07/23: Needs cognitive PROM next session. Pt is taking breaks when she feels HA or over-stimulated- "I remember what you said last time, take breaks." Pt gets HA when needing to focus; this occurred a few times each day. CLQT completed today and results above. Performance on CLQT suggests pt has difficulties resulting from decr'd attention skills and memory. SLP also discussed compensations for pt doing things with her daughters that would be quieter in nature to minimize HA and mental fatigue due to pt-reported selective attention deficits in visually or auditory stimulating environments.  09/24/23:  Evaluation results, POC addressing attention, memory, and eval to  complete next session with other goals added PRN. Pt and husband were educated about common sx cognitive communication deficits following CVA. SLP shared with pt and husband about Constant Therapy, and answered pt questions about other games/activities that are helpful in cognitive rehab. SLP told pt/husband that x2/week ST was recommended over x1/week ST - pt to check with friend to see if she can drive pt [id number] and husband would take pt x1/week. Lastly SLP educated pt and husband about need for pt to take breaks during focused tasks when HA too severe, based upon pt performance today (developed HA 5/10 pain after focused work on evaluation task).  PATIENT EDUCATION: Education details: see "treatment date" above  Person educated: Patient and Spouse Education method: Explanation, Demonstration, and Handouts Education comprehension: verbalized understanding and needs further education   GOALS: Goals reviewed with patient? Yes, generally  SHORT TERM GOALS: Target date: 10/29/23 (due to scheduling)  Pt will use one memory strategy in or between 3 sessions Baseline: Goal status: INITIAL  2.  Pt will incr selective attention skills in functional linguistic tasks for 10 minutes in mod noisy environment x2 sessions Baseline:  Goal status: INITIAL  3.  Pt will successfully use trained compensations for attention and mental fatigue between 3 sessions prior to 10/29/23 Baseline:  Goal status: INITIAL  4.  Pt will complete detailed linguistic tasks involving a computer and/or phone 90% accuracy using self correction x3 sessions Baseline:  Goal status: INITIAL    LONG TERM GOALS: Target date: 11/26/23  Pt will improve PROM score compared to initial administration Baseline:  Goal status: INITIAL  2.  Pt will successfully use two memory strategy in or between 3 sessions Baseline:  Goal status: INITIAL  3.  Pt will incr selective attention skills in functional linguistic tasks for 20  minutes in mod noisy environment x2 sessions Baseline:  Goal status: INITIAL  4.  Pt will successfully use trained compensations for attention and mental fatigue between 3 sessions after 10/29/23 Baseline:  Goal status: INITIAL  5.  Pt will complete detailed linguistic tasks involving a computer and/or phone 100% accuracy using self correction x3 sessions Baseline:  Goal status: INITIAL   ASSESSMENT:  CLINICAL IMPRESSION: Patient is a 48 y.o. F who was seen today for treatment of cognitive communication skills following CVA in early March 2025. Pt cont to demonstrate deficits in memory, and attention. Error awareness was decr'd today. See "treatment date" above for more details on today's session.   OBJECTIVE IMPAIRMENTS: include attention, memory, expressive language, and receptive language. These impairments are limiting patient from return to work, household responsibilities, ADLs/IADLs, and effectively communicating at home and in community. Factors affecting potential to achieve goals and functional outcome are pain level and possible transportation limitation affecting frequency of ST .Marland Kitchen Patient will benefit from skilled SLP services to address above impairments and improve overall function.  REHAB POTENTIAL: Good, but dependent upon frequency that pt can attend ST  PLAN:  SLP FREQUENCY: 1-2x/week (x2/week recommended but due to pt's transportation may require x1/week)  SLP DURATION: 8 weeks  PLANNED INTERVENTIONS: Environmental controls, Cueing hierachy, Cognitive reorganization, Internal/external aids, Functional tasks, SLP instruction and feedback, Compensatory strategies, Patient/family education, and 54098 Treatment of speech (30 or 45 min)     Saima Monterroso, CCC-SLP 10/12/2023, 4:50 PM

## 2023-10-12 NOTE — Therapy (Signed)
 OUTPATIENT PHYSICAL THERAPY NEURO TREATMENT   Patient Name: Kayla Strickland MRN: [id number] DOB:05/08/1976, 48 y.o., female Today's Date: 10/12/2023   PCP: No PCP REFERRING PROVIDER: Rhetta Mura, MD >to follow up with Dr. Pearlean Brownie  END OF SESSION:  PT End of Session - 10/12/23 1538     Visit Number 3    Number of Visits 8    Date for PT Re-Evaluation 11/12/23    Authorization Type [employer] 2025-no auth required, no VL    PT Start Time 1538   arrives late   PT Stop Time 1617    PT Time Calculation (min) 39 min    Equipment Utilized During Treatment Gait belt    Activity Tolerance Patient tolerated treatment well    Behavior During Therapy WFL for tasks assessed/performed               Past Medical History:  Diagnosis Date   Endometriosis    Medical history non-contributory    Past Surgical History:  Procedure Laterality Date   CESAREAN SECTION  08/22/2011   Procedure: CESAREAN SECTION;  Surgeon: Roseanna Rainbow, MD;  Location: [facility];  Service: Gynecology;  Laterality: N/A;  Primary cesarean section with delivery of baby girl at 67. Apgars 8/9.   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   Cesarean delivery delivered 08/23/2011   Chorioamnionitis, delivered, current hospitalization 08/23/2011   Post-term pregnancy, 40-42 weeks of gestation 08/22/2011    ONSET DATE: 09/13/2023  REFERRING DIAG:  I77.74 (ICD-10-CM) - Vertebral artery dissection (HCC)  I63.9 (ICD-10-CM) - Acute CVA (cerebrovascular accident)  (HCC)    THERAPY DIAG:  Unsteadiness on feet  Muscle weakness (generalized)  Other abnormalities of gait and mobility  Rationale for Evaluation and Treatment: Rehabilitation  SUBJECTIVE:                                                                                                                                                                                             SUBJECTIVE STATEMENT: Things are better.  Still having some headaches, not as much.  The right side (arm and leg, chest area) is sore, especially after I walk or did cleaning activities.  To see neurologist 4/15 and my PCP that week as well.  Pt accompanied by: mother, daughter   PERTINENT HISTORY: HLD, PFO (likely not the cause of CVA, as pt's CVA was due to vertebral artery dissection).  48 yo female admitted 09/13/2023 with acute R cerebellar infarct with possible R vertebral artery dissection, anemia microcytic hypochromic  PAIN:  Are you having pain? Yes: NPRS scale: 3/10 Pain location: R side-arm, leg, chest Pain description: sore, weakness Aggravating factors: doing too much Relieving factors: Tylenol    *Occasional headaches  PRECAUTIONS: Other: No driving, no return to work, no popping of neck until at least 10/07/2023  RED FLAGS: None   WEIGHT BEARING RESTRICTIONS: No  FALLS: Has patient fallen in last 6 months? No  LIVING ENVIRONMENT: Lives with: lives with their family Has daughter, mother, friends helping out Lives in: House/apartment Stairs: flight of steps to bedroom Has following equipment at home: None  PLOF: Independent, Vocation/Vocational requirements: works at [employer], mostly seated job, and Leisure: enjoys walking and being outside  PATIENT GOALS: To do the best for me to do what I can to do better.  OBJECTIVE:      TODAY'S TREATMENT: 10/12/2023 Activity Comments  Reviewed full HEP Good return demo, slowed performance, and minor c/o pain RLE  Fwd/back stepping  No head  motion, no UE support  Fwd/back stepping + head nods  Light UE support  Minisquats to up on toes x 10   Sidestep squats R and L C/o pain in R hand  Stretches: R quad stretch in hooklying supine Trunk rotation stretch R SKTC  Cues for technique; pt report stretch/discomfort low back into buttocks   Access Code: [email or web address] URL: [email or web address] Date: 10/12/2023 Prepared by: [facility] - Outpatient  Rehab - [facility]  Exercises - Sit to stand in stride stance  - 1 x daily - 7 x weekly - 3 sets - 5-10 reps - Step Taps on High Step  - 1 x daily - 7 x weekly - 3 sets - 10 reps - Forward Step Down with Heel Tap and Counter Support  - 1 x daily - 5 x weekly - 2 sets - 10 reps - Alternating Step Forward with Support  - 1 x daily - 5 x weekly - 2 sets - 10 reps - Heel Walking with Counter Support  - 1 x daily - 5 x weekly - 2 sets - 3-5 reps - Hooklying Single Knee to Chest Stretch  - 1 x daily - 7 x weekly - 1 sets - 5 reps - 30 sec hold - Supine Quadriceps Stretch with Strap on Table  - 1-2 x daily - 7 x weekly - 1 sets - 3-5 reps - 15-30 sec hold      PATIENT EDUCATION: Education details: Continue current HEP + addition to HEP today.  Recommend pt follow up with questions to MD about pain that is R UE>trunk>lower extremity (she is to see neurologist 4/15 and wants to wait for more appts here until after that MD visit) Person educated: Patient Education method: Explanation, Demonstration, Tactile cues, Verbal cues, and Handouts Education comprehension: verbalized understanding and returned demonstration    Note: Objective measures were completed at Evaluation unless otherwise noted.  DIAGNOSTIC FINDINGS: Diminished caliber and opacification of the right vertebral artery from the origin to the distal V2 segment. 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 Findings concerning for vertebral artery  dissection.  Expected evolution of small right cerebellar infarct since 09/13/2023.  COGNITION: Overall cognitive status: Within functional limits for tasks assessed   SENSATION: Light touch: Impaired  and little less sensation noted RLE RLE feels heavy COORDINATION: Heel to shin and alternating tapping WFL  MUSCLE TONE: RLE: Mild    LOWER EXTREMITY ROM:   WFL BLEs   LOWER EXTREMITY MMT:    MMT Right Eval Left Eval  Hip flexion 4 5  Hip extension    Hip abduction    Hip adduction    Hip internal rotation    Hip external rotation    Knee flexion 4+ 5  Knee extension 4+ 5  Ankle dorsiflexion 4 5  Ankle plantarflexion    Ankle inversion    Ankle eversion    (Blank rows = not tested)    TRANSFERS: Assistive device utilized: None  Sit to stand: Modified independence Stand to sit: Modified independence  STAIRS: Level of Assistance: Modified independence Stair Negotiation Technique: Alternating Pattern  with Single Rail on Left Number of Stairs: 3  Height of Stairs: 4-6"    GAIT: Gait pattern: step through pattern, decreased ankle dorsiflexion- Right, and poor foot clearance- Right Distance walked: 50 ft Assistive device utilized: None Level of assistance: Modified independence Comments: slowed gait pace, reports fatigue  FUNCTIONAL TESTS:  5 times sit to stand: 18.59 sec arms crossed at chest Timed up and go (TUG): 15.28 sec 10 meter walk test: 13.22 sec (2.48 ft/sec) Functional gait assessment: NT DGI:  15/24 (Scores <19/24 indicate increased fall risk)        Vitals:  104/76                                                                                                                        TREATMENT DATE: 09/24/2023    PATIENT EDUCATION: Education details: Eval results, POC to address balance, strength, fall risk per objective measures; initiated HEP; answered questions about fatigue following CVA Person educated: Patient and Spouse Education  method: Explanation, Demonstration, and Handouts Education comprehension: verbalized understanding and returned demonstration  HOME EXERCISE PROGRAM: Access Code: [email or web address] URL: [email or web address] Date: 09/24/2023 Prepared by: [facility] - Outpatient  Rehab - [facility]  Exercises - Sit to stand in stride stance  - 1 x daily - 7 x weekly - 3 sets - 5-10 reps - Step Taps on High Step  - 1 x daily - 7 x weekly - 3 sets - 10 reps  GOALS: Goals reviewed with patient? Yes  SHORT TERM GOALS: Target date: 10/15/2023  Pt will be independent with HEP for improved strength, balance, gait. Baseline: Goal status: IN PROGRESS  2.  Pt will improve 5x sit<>stand to less than or equal to 11.5 sec to demonstrate improved functional strength and transfer efficiency. Baseline: 18.59 sec Goal status: IN PROGRESS  3.  Pt will improve TUG score to less than or equal to 13.5 sec for decreased fall risk. Baseline: 15 sec Goal status: IN PROGRESS  LONG TERM GOALS: Target date: 11/12/2023  Pt will be independent with progression of HEP for improved strength, balance, gait. Baseline:  Goal status: IN PROGRESS  2.  Pt will improve gait velocity to at least 3 ft/sec for improved gait efficiency and safety. Baseline: 2.48 ft/sec Goal status: IN PROGRESS  3.  Pt will improve DGI score to at least 19/24 to decrease fall risk. Baseline:  Goal status: IN PROGRESS  4.  Pt will ambulate at least 1000 ft indoor/outdoor surfaces for improved independent return to community mobility. Baseline:  Goal status: IN PROGRESS  ASSESSMENT:  CLINICAL IMPRESSION: Pt presents today with reports of improved ease of exercise, but still R sided pain and discomfort with attempts at increase activity. Skilled PT session focused on review of and progression of HEP.  Exercises provided today for gentle R quad and low back stretch, as pt notes RUE/trunk/RLE pain with increased activities; unsure if this is  nerve pain, sensation changes/heaviness following stroke, or muscular pain due to compensatory movements.  PT educated pt in importance of weightbearing exercise and progression of HEP; encouraged pt to speak to MD about her pain.  She has upcoming neurologist appt and wants to wait to return to PT until after that appointment.  Pt will continue to benefit from skilled PT towards goals for improved functional mobility and decreased fall risk.   OBJECTIVE IMPAIRMENTS: Abnormal gait, decreased balance, decreased endurance, decreased mobility, difficulty walking, and decreased strength.   ACTIVITY LIMITATIONS: transfers, locomotion level, and caring for others  PARTICIPATION LIMITATIONS: meal prep, cleaning, laundry, driving, shopping, community activity, and yard work  PERSONAL FACTORS: 1-2 comorbidities: see above  are also affecting patient's functional outcome.   REHAB POTENTIAL: Good  CLINICAL DECISION MAKING: Evolving/moderate complexity  EVALUATION COMPLEXITY: Moderate  PLAN:  PT FREQUENCY: 1x/week  PT DURATION: 8 weeks including eval visit  PLANNED INTERVENTIONS: 97110-Therapeutic exercises, 97530- Therapeutic activity, 97112- Neuromuscular re-education, 97535- Self Care, 29518- Manual therapy, [phone]- Gait training, Patient/Family education, Balance training, and Stair training  PLAN FOR NEXT SESSION: Check habituation and vestibular testing (pt c/o dizziness with looking down>up with washing dishes and with supine>sit)   Review  HEP update and progress for RLE NMR, strengthening, balance   Lonia Blood, PT 10/12/23 5:21 PM Phone: [phone] Fax: [phone]  [facility] at [facility] [address], [address] [place], [place] [address] Phone # [phone] Fax # [phone]

## 2023-10-17 ENCOUNTER — Other Ambulatory Visit (HOSPITAL_BASED_OUTPATIENT_CLINIC_OR_DEPARTMENT_OTHER): Payer: Self-pay

## 2023-10-18 ENCOUNTER — Other Ambulatory Visit (HOSPITAL_BASED_OUTPATIENT_CLINIC_OR_DEPARTMENT_OTHER): Payer: Self-pay

## 2023-10-19 ENCOUNTER — Encounter: Payer: Self-pay | Admitting: Internal Medicine

## 2023-10-19 DIAGNOSIS — I639 Cerebral infarction, unspecified: Secondary | ICD-10-CM | POA: Diagnosis not present

## 2023-10-27 ENCOUNTER — Encounter: Payer: Self-pay | Admitting: *Deleted

## 2023-10-27 ENCOUNTER — Other Ambulatory Visit (HOSPITAL_BASED_OUTPATIENT_CLINIC_OR_DEPARTMENT_OTHER): Payer: Self-pay

## 2023-10-27 ENCOUNTER — Ambulatory Visit: Admitting: Neurology

## 2023-10-27 ENCOUNTER — Encounter: Payer: Self-pay | Admitting: Neurology

## 2023-10-27 VITALS — BP 113/71 | HR 74 | Ht 64.0 in | Wt 158.0 lb

## 2023-10-27 DIAGNOSIS — I6501 Occlusion and stenosis of right vertebral artery: Secondary | ICD-10-CM

## 2023-10-27 DIAGNOSIS — Q2112 Patent foramen ovale: Secondary | ICD-10-CM | POA: Diagnosis not present

## 2023-10-27 DIAGNOSIS — G4452 New daily persistent headache (NDPH): Secondary | ICD-10-CM

## 2023-10-27 DIAGNOSIS — I639 Cerebral infarction, unspecified: Secondary | ICD-10-CM

## 2023-10-27 MED ORDER — TOPIRAMATE 25 MG PO TABS
25.0000 mg | ORAL_TABLET | Freq: Two times a day (BID) | ORAL | 3 refills | Status: AC
  Filled 2023-10-27: qty 120, 60d supply, fill #0
  Filled 2024-05-20 – 2024-06-19 (×2): qty 120, 60d supply, fill #1

## 2023-10-27 NOTE — Patient Instructions (Signed)
 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 I also advised the patient to eat a healthy diet with plenty of whole grains, cereals, fruits and vegetables, exercise regularly and maintain ideal body weight.  Check repeat CT angiogram to follow right vertebral artery recanalization.  She has small PFO and ROPE score of 8 but I feel it is likely incidental and do not advocate closure at this time. We talked about analgesic rebound headache symptoms for migraine and I recommend she discontinue daily Tylenol that she is taking.  Try Topamax 25 mg twice daily instead.  She was advised to call me if headaches do not improve may consider referral to Dr. Tresia Fruit headache specialist.  Followup in the future with me in 6 months or call earlier if necessary.  Vertebral Artery Dissection  Vertebral artery dissection is a tear in a vertebral artery. The vertebral arteries are major blood vessels at the base of the neck. They carry blood from the heart to the brain. When an artery tears, blood collects inside the layers of the artery wall. This can cause a blood clot. This condition increases the risk for stroke if it is not diagnosed and treated right away. It is a common cause of stroke in people who are 70-75 years old. What are the causes? This condition may be caused by: A neck injury due to sudden or  too much neck movement. This is called a traumatic dissection. Having weak blood vessel walls. The walls may tear even when no injury occurs (spontaneous dissection). Chiropractic adjustments. A minimal form of blunt trauma. In many cases, the cause of this condition is not known. What increases the risk? The following factors may make you more likely to develop this condition: High blood pressure (hypertension). Migraines. Inherited diseases that affect the strength or shape of the blood vessels. Tobacco use. What are the signs or symptoms? Symptoms usually appear within days of an injury, but sometimes they may not appear for weeks or years. Common symptoms of this condition include: Headache, with a sharp, stabbing pain in the head, neck, eye, or face. Dizziness. Vertigo. This is a feeling that you or things around you are moving when they are not. Double vision. Other symptoms include: Hoarse voice. Hearing loss. Hiccups. Loss of taste. Difficulty speaking. Loss of balance and coordination. Difficulty swallowing. Ear pain. Nausea and vomiting. Loss of feeling in the torso, legs, or arms. How is this diagnosed? This condition is diagnosed based on tests, such as: CT angiogram. This test uses a computer to take X-rays of your vertebral arteries. A dye may be injected into your blood to show the inside of your blood vessels more clearly. MRI angiogram. This is used to check the health of the blood vessels. Cerebral angiogram. This test takes X-ray images of the blood vessels in the neck and brain. A dye is used to show the inside of the blood vessels. Doppler ultrasound. This test uses sound waves to create  images of the vertebral arteries. It shows how well blood flows through your arteries. How is this treated? Treatment depends on the cause of your vertebral artery dissection and on your overall health. The goal of treatment is to prevent a stroke. If you are having a stroke,  it is important to get treatment quickly. Treatment may include: Blood thinners. This medicine helps to prevent blood clots. This may be given first through an IV, and then as pills for 3-6 months. Antiplatelet medicines. This medicine keeps the platelets from sticking together, which prevents blood clots from forming. Procedures to widen a narrow blood vessel (angioplasty) or to place a mesh tube (stent) inside the blood vessel to keep it open. Surgery to repair the area. This is rarely needed. Follow these instructions at home: Medicines Take over-the-counter and prescription medicines only as told by your health care provider. If you are taking blood thinners: Talk with your health care provider before you take any medicines that contain aspirin or NSAIDs, such as ibuprofen. These medicines increase your risk for dangerous bleeding. Take your medicine exactly as told, at the same time every day. Avoid activities that could cause injury or bruising. Follow instructions about how to prevent falls. Wear a medical alert bracelet or carry a card that lists what medicines you take. Lifestyle  Work with your health care provider to control hypertension. This may include: Exercising regularly. Check with your health care provider before starting a new type of exercise. Eating a heart-healthy diet of fruits, vegetables, whole grains, and lean meats. Limit unhealthy fats. Eat more healthy fats such as avocados, eggs, and oily fish. Reducing the amount of salt (sodium) that you eat to less than 1,500 mg a day. Reducing stress by doing things that you enjoy and avoiding things that cause you stress. Avoid activities that put you at risk for neck injuries, such as contact sports. Do not use any products that contain nicotine or tobacco. These products include cigarettes, chewing tobacco, and vaping devices, such as e-cigarettes. If you need help quitting, ask your health care provider. General  instructions Do not drink alcohol if: Your health care provider tells you not to drink. You are pregnant, may be pregnant, or are planning to become pregnant. If you drink alcohol: Limit how much you have to: 0-1 drink a day for women. 0-2 drinks a day for men. Know how much alcohol is in your drink. In the [place] one drink equals one 12 oz bottle of beer (355 mL), one 5 oz glass of wine (148 mL), or one 1 oz glass of hard liquor (44 mL). Keep all follow-up visits. This is important. Get help right away if: You have any symptoms of a stroke. "BE FAST" is an easy way to remember the main warning signs of a stroke: B - Balance. Signs are dizziness, sudden trouble walking, or loss of balance. E - Eyes. Signs are trouble seeing or a sudden change in vision. F - Face. Signs are sudden weakness or numbness of the face, or the face or eyelid drooping on one side. A - Arms. Signs are weakness or numbness in an arm. This happens suddenly and usually on one side of the body. S - Speech. Signs are sudden trouble speaking, slurred speech, or trouble understanding what people say. T - Time. Time to call emergency services. Write down what time symptoms started. You have other signs of a stroke, such as: A sudden, severe headache with no known cause. Nausea or  vomiting. Seizure. You have other symptoms, such as: Difficulty breathing. Chest pain. These symptoms may be an emergency. Get help right away. Call 911. Do not wait to see if the symptoms will go away. Do not drive yourself to the hospital. Summary Vertebral artery dissection is a tear in an artery that carries blood from the heart to the brain. Symptoms usually appear within days of an injury, but sometimes they may not appear for weeks or years. This condition increases the risk for stroke if it is not diagnosed and treated right away. Treatment depends on the cause of your condition and on your overall health. The goal of treatment is to  prevent a stroke. Get help right away if you have any symptoms of a stroke. This information is not intended to replace advice given to you by your health care provider. Make sure you discuss any questions you have with your health care provider. Document Revised: 05/13/2021 Document Reviewed: 05/13/2021 Elsevier Patient Education  2024 [employer].

## 2023-10-27 NOTE — Progress Notes (Signed)
 [facility] [address] [place]. [place] [address] [phone]       OFFICE CONSULT NOTE  Ms. [name] Date of Birth:  1976-07-12 Medical Record Number:  [id number]   Referring MD: Marvel Plan  Reason for Referral: Stroke  HPI: Kayla Strickland is a 48 year old pleasant Hispanic lady seen today for initial office consultation visit for stroke.  She is accompanied by her husband.  History is obtained from them and review of electronic medical records.  I personally reviewed pertinent available imaging films in PACS.  She has past medical history of endometriosis and menstrual migraines.  She presented on 09/13/2023 with several day history of dizziness, vertigo, nausea and gait ataxia and headaches.  She did describe some right-sided posterior neck and head pain as well.  She denied any recent fall injury or lifting heavy weights or chiropractic manipulation.  CT head showed no acute findings but CT angiogram showed significantly diminished caliber and the right vertebral artery and occlusion at the proximal V3 and V4 segments raising concern for dissection.  MRI scan of the brain was suboptimal due to dental brace artifacts but suggested subacute infarct in the right deep cerebellum.  Echocardiogram showed ejection fraction of 60 to 65%.  Left atrial size was normal.  LDL cholesterol was 110 mg percent.  Hemoglobin A1c was 5.3.  TCD bubble study was positive for small right-to-left shunt.  Outpatient 2-week cardiac monitor was negative for atrial fibrillation.  Patient was seen by interventional cardiologist Dr. Lynnette Caffey as outpatient patient's PFO was felt to be incidental and not likely cause of stroke which was likely due to dissection.  Patient states her dizziness and ataxia has improved.  Her headaches however have gotten worse.  She now describes a new daily persistent headache.  This is moderate in intensity.  She has been taking Tylenol 4 to 6 tablets a day on a daily  basis which takes the edge off but does not particular the headache.  She does give prior history of menstrual migraines which are not as bad.  The present menstrual cycle she is bleeding a lot for several days since she is on aspirin and Plavix.  She also complains of some right-sided numbness which she has had since her stroke which is improving but has not gone away.  She has not been on any migraine specific medications.  She denies any other prior history of strokes TIAs seizures, any recurrent miscarriages, DVT or pulmonary embolism. She is tolerating Lipitor well without muscle aches and pains. ROS:   14 system review of systems is positive for headache, neck pain, numbness, dizziness, nausea, vertigo all other systems negative  PMH:  Past Medical History:  Diagnosis Date   Endometriosis    Medical history non-contributory     Social History:  Social History   Socioeconomic History   Marital status: Married    Spouse name: Not on file   Number of children: Not on file   Years of education: Not on file   Highest education level: Not on file  Occupational History   Not on file  Tobacco Use   Smoking status: Never   Smokeless tobacco: Never  Vaping Use   Vaping status: Never Used  Substance and Sexual Activity   Alcohol use: Yes    Comment: occ   Drug use: No   Sexual activity: Yes    Birth control/protection: None  Other Topics Concern   Not on file  Social History Narrative  Not on file   Social Drivers of Health   Financial Resource Strain: Medium Risk (10/24/2023)   Received from [facility]   Overall Financial Resource Strain (CARDIA)    Difficulty of Paying Living Expenses: Somewhat hard  Food Insecurity: No Food Insecurity (10/24/2023)   Received from [facility]   Hunger Vital Sign    Worried About Running Out of Food in the Last Year: Never true    Ran Out of Food in the Last Year: Never true  Transportation Needs: No Transportation Needs (10/24/2023)    Received from [facility] - Transportation    Lack of Transportation (Medical): No    Lack of Transportation (Non-Medical): No  Physical Activity: Insufficiently Active (10/24/2023)   Received from [facility]   Exercise Vital Sign    Days of Exercise per Week: 1 day    Minutes of Exercise per Session: 20 min  Stress: Stress Concern Present (10/24/2023)   Received from [facility] - Occupational Stress Questionnaire    Feeling of Stress : Very much  Social Connections: Socially Integrated (10/24/2023)   Received from [facility]   Social Network    How would you rate your social network (family, work, friends)?: Good participation with social networks  Intimate Partner Violence: Not At Risk (10/24/2023)   Received from [facility]   HITS    Over the last 12 months how often did your partner physically hurt you?: Never    Over the last 12 months how often did your partner insult you or talk down to you?: Never    Over the last 12 months how often did your partner threaten you with physical harm?: Never    Over the last 12 months how often did your partner scream or curse at you?: Rarely    Medications:   Current Outpatient Medications on File Prior to Visit  Medication Sig Dispense Refill   acetaminophen (TYLENOL) 325 MG tablet Take 2 tablets (650 mg total) by mouth every 4 (four) hours as needed for mild pain (pain score 1-3) (or temp > 37.5 C (99.5 F)).     atorvastatin (LIPITOR) 40 MG tablet Take 1 tablet (40 mg total) by mouth daily. 90 tablet 0   clopidogrel (PLAVIX) 75 MG tablet Take 1 tablet (75 mg total) by mouth daily. 90 tablet 0   ferrous sulfate 325 (65 FE) MG tablet Take 1 tablet (325 mg total) by mouth 2 (two) times daily with a meal.     aspirin EC 81 MG tablet Take 1 tablet (81 mg total) by mouth daily. Swallow whole. (Patient not taking: Reported on 10/27/2023)     senna-docusate (SENOKOT-S) 8.6-50 MG  tablet Take 1 tablet by mouth 2 (two) times daily between meals as needed for mild constipation. (Patient not taking: Reported on 10/27/2023)     No current facility-administered medications on file prior to visit.    Allergies:  No Known Allergies  Physical Exam General: well developed, well nourished pleasant middle-aged Hispanic lady, seated, in no evident distress Head: head normocephalic and atraumatic.   Neck: supple with no carotid or supraclavicular bruits Cardiovascular: regular rate and rhythm, no murmurs Musculoskeletal: no deformity Skin:  no rash/petichiae Vascular:  Normal pulses all extremities  Neurologic Exam Mental Status: Awake and fully alert. Oriented to place and time. Recent and remote memory intact. Attention span, concentration and fund of knowledge appropriate. Mood and affect appropriate.  Cranial Nerves: Fundoscopic  exam reveals sharp disc margins. Pupils equal, briskly reactive to light. Extraocular movements full without nystagmus. Visual fields full to confrontation. Hearing intact. Facial sensation intact. Face, tongue, palate moves normally and symmetrically.  Motor: Normal bulk and tone. Normal strength in all tested extremity muscles. Sensory.: intact to touch , pinprick , position and vibratory sensation.  Coordination: Rapid alternating movements normal in all extremities. Finger-to-nose and heel-to-shin performed accurately bilaterally. Gait and Station: Arises from chair without difficulty. Stance is normal. Gait demonstrates normal stride length and balance . Able to heel, toe and tandem walk with slight difficulty.  Reflexes: 1+ and symmetric. Toes downgoing.   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 I also advised the patient to eat a healthy diet with plenty of whole grains, cereals, fruits and vegetables, exercise regularly and maintain ideal body weight.  Check repeat CT angiogram to follow right vertebral artery recanalization.  She has small PFO and ROPE score of 8 but I feel it is likely incidental and do not advocate closure at this time. We talked about analgesic rebound headache symptoms for migraine and I recommend she discontinue daily Tylenol that she is taking.  Try Topamax 25 mg twice daily instead.  She was advised to call me if headaches do not improve may consider referral to Dr. Tresia Fruit headache specialist.  Followup in the future with me in 6 months or call earlier if necessary. Greater than 50% time during this prolonged 60-minute consultation visit was spent on counseling and coordination of care about cerebellar stroke, vertebral artery dissection, migraine headaches and transformed new persistent daily headache with analgesic rebound and answering questions Ardella Beaver, MD Note: This document was prepared with digital dictation and possible smart phrase technology. Any transcriptional errors that result from this process are  unintentional.

## 2023-10-28 ENCOUNTER — Telehealth: Payer: Self-pay | Admitting: Neurology

## 2023-10-28 DIAGNOSIS — Z0289 Encounter for other administrative examinations: Secondary | ICD-10-CM

## 2023-10-28 NOTE — Telephone Encounter (Signed)
 sent to GI They obtain Nell Bamberger [phone]

## 2023-11-02 ENCOUNTER — Telehealth: Payer: Self-pay | Admitting: *Deleted

## 2023-11-02 NOTE — Telephone Encounter (Signed)
 Lt voice mail not able to reach pt. Pt needs to sign a release form,

## 2023-11-05 ENCOUNTER — Inpatient Hospital Stay: Admission: RE | Admit: 2023-11-05 | Source: Ambulatory Visit

## 2023-11-05 ENCOUNTER — Ambulatory Visit
Admission: RE | Admit: 2023-11-05 | Discharge: 2023-11-05 | Disposition: A | Discharge status: Home / Self Care | Source: Ambulatory Visit | Attending: Neurology | Admitting: Neurology

## 2023-11-05 MED ORDER — IOPAMIDOL (ISOVUE-370) INJECTION 76%
75.0000 mL | Freq: Once | INTRAVENOUS | Status: AC | PRN
  Administered 2023-11-05: 75 mL via INTRAVENOUS

## 2023-11-09 NOTE — Progress Notes (Signed)
 Kindly inform the patient that CT angiogram study of the blood vessels of the brain shows slight improvement in flow and the previously blocked blood vessel but nothing to worry about.  No new or worrisome findings

## 2023-11-11 ENCOUNTER — Other Ambulatory Visit: Payer: Self-pay | Admitting: Neurology

## 2023-11-11 NOTE — Telephone Encounter (Addendum)
 Pt said, want to be sure have sent information to [facility] due to did not get paid for the last 2 weeks.

## 2023-11-15 ENCOUNTER — Ambulatory Visit: Admitting: Family Medicine

## 2023-11-15 ENCOUNTER — Telehealth: Payer: Self-pay | Admitting: *Deleted

## 2023-11-15 NOTE — Telephone Encounter (Signed)
 Paperwork given to medical records. I was waiting for the pt to respond to mychart message

## 2023-11-15 NOTE — Telephone Encounter (Signed)
 Pt [employer] form faxed on 11/15/2023 to [phone]

## 2023-11-29 ENCOUNTER — Telehealth: Payer: Self-pay | Admitting: Neurology

## 2023-11-29 NOTE — Telephone Encounter (Signed)
 Pt called stating that Provider had gave Pt work restriction. Pt states Hr informed pt that they have not receive anything from Provider . Pt want to request for provider to call or email HR Director to let them know those  restriction .   Director Audry Leavell  [employer]  Phone # [phone] Email:[email or web address]

## 2023-11-29 NOTE — Telephone Encounter (Signed)
 Pt brought the paperwork in and states that there was a portion that needed to be updated to reflect as part time, how should she work. I completed stating she can work up to 5 hours a day until returning to work full time on 12/12/2023. Paper updated and faxed to the number below. Original copy given to the patient.

## 2023-12-13 ENCOUNTER — Other Ambulatory Visit (HOSPITAL_BASED_OUTPATIENT_CLINIC_OR_DEPARTMENT_OTHER): Payer: Self-pay

## 2023-12-13 MED ORDER — SULFAMETHOXAZOLE-TRIMETHOPRIM 800-160 MG PO TABS
1.0000 | ORAL_TABLET | Freq: Two times a day (BID) | ORAL | 0 refills | Status: AC
  Filled 2023-12-13: qty 6, 3d supply, fill #0

## 2023-12-23 ENCOUNTER — Other Ambulatory Visit (HOSPITAL_BASED_OUTPATIENT_CLINIC_OR_DEPARTMENT_OTHER): Payer: Self-pay

## 2023-12-31 ENCOUNTER — Other Ambulatory Visit (HOSPITAL_BASED_OUTPATIENT_CLINIC_OR_DEPARTMENT_OTHER): Payer: Self-pay

## 2023-12-31 MED ORDER — NA SULFATE-K SULFATE-MG SULF 17.5-3.13-1.6 GM/177ML PO SOLN
177.0000 mL | Freq: Two times a day (BID) | ORAL | 0 refills | Status: AC
  Filled 2023-12-31: qty 354, 1d supply, fill #0

## 2024-01-05 ENCOUNTER — Other Ambulatory Visit (HOSPITAL_BASED_OUTPATIENT_CLINIC_OR_DEPARTMENT_OTHER): Payer: Self-pay

## 2024-02-03 ENCOUNTER — Encounter: Payer: Self-pay | Admitting: Neurology

## 2024-03-21 ENCOUNTER — Other Ambulatory Visit (HOSPITAL_BASED_OUTPATIENT_CLINIC_OR_DEPARTMENT_OTHER): Payer: Self-pay

## 2024-03-21 MED ORDER — GABAPENTIN 100 MG PO CAPS
100.0000 mg | ORAL_CAPSULE | Freq: Every day | ORAL | 2 refills | Status: DC
  Filled 2024-03-21: qty 45, 30d supply, fill #0
  Filled 2024-04-17: qty 45, 30d supply, fill #1
  Filled 2024-05-20: qty 45, 30d supply, fill #2

## 2024-03-21 MED ORDER — PAROXETINE HCL 10 MG PO TABS
10.0000 mg | ORAL_TABLET | Freq: Every day | ORAL | 2 refills | Status: DC
  Filled 2024-03-21: qty 30, 30d supply, fill #0
  Filled 2024-04-17: qty 30, 30d supply, fill #1
  Filled 2024-05-20: qty 30, 30d supply, fill #2

## 2024-03-21 MED ORDER — NORETHINDRONE ACETATE 5 MG PO TABS
10.0000 mg | ORAL_TABLET | Freq: Every day | ORAL | 12 refills | Status: AC
  Filled 2024-03-21 – 2024-06-19 (×6): qty 60, 30d supply, fill #0

## 2024-03-22 ENCOUNTER — Other Ambulatory Visit (HOSPITAL_BASED_OUTPATIENT_CLINIC_OR_DEPARTMENT_OTHER): Payer: Self-pay

## 2024-04-18 ENCOUNTER — Other Ambulatory Visit: Payer: Self-pay

## 2024-04-18 ENCOUNTER — Other Ambulatory Visit (HOSPITAL_BASED_OUTPATIENT_CLINIC_OR_DEPARTMENT_OTHER): Payer: Self-pay

## 2024-04-19 ENCOUNTER — Other Ambulatory Visit (HOSPITAL_BASED_OUTPATIENT_CLINIC_OR_DEPARTMENT_OTHER): Payer: Self-pay

## 2024-04-19 MED ORDER — POLYETHYLENE GLYCOL 3350 17 GM/SCOOP PO POWD
17.0000 g | Freq: Every day | ORAL | 1 refills | Status: AC
  Filled 2024-04-19: qty 510, 30d supply, fill #0
  Filled 2024-05-20: qty 510, 30d supply, fill #1

## 2024-05-21 ENCOUNTER — Other Ambulatory Visit (HOSPITAL_BASED_OUTPATIENT_CLINIC_OR_DEPARTMENT_OTHER): Payer: Self-pay

## 2024-05-22 ENCOUNTER — Other Ambulatory Visit (HOSPITAL_BASED_OUTPATIENT_CLINIC_OR_DEPARTMENT_OTHER): Payer: Self-pay

## 2024-05-22 ENCOUNTER — Other Ambulatory Visit: Payer: Self-pay

## 2024-06-19 ENCOUNTER — Other Ambulatory Visit (HOSPITAL_BASED_OUTPATIENT_CLINIC_OR_DEPARTMENT_OTHER): Payer: Self-pay

## 2024-06-20 ENCOUNTER — Other Ambulatory Visit: Payer: Self-pay

## 2024-06-20 ENCOUNTER — Other Ambulatory Visit (HOSPITAL_BASED_OUTPATIENT_CLINIC_OR_DEPARTMENT_OTHER): Payer: Self-pay

## 2024-06-27 ENCOUNTER — Other Ambulatory Visit: Payer: Self-pay

## 2024-06-27 ENCOUNTER — Other Ambulatory Visit (HOSPITAL_BASED_OUTPATIENT_CLINIC_OR_DEPARTMENT_OTHER): Payer: Self-pay

## 2024-06-27 MED ORDER — GABAPENTIN 100 MG PO CAPS
ORAL_CAPSULE | ORAL | 2 refills | Status: AC
  Filled 2024-06-27: qty 45, 30d supply, fill #0
  Filled 2024-07-19: qty 45, 30d supply, fill #1

## 2024-06-27 MED ORDER — PAROXETINE HCL 10 MG PO TABS
10.0000 mg | ORAL_TABLET | Freq: Every day | ORAL | 2 refills | Status: AC
  Filled 2024-06-27: qty 30, 30d supply, fill #0
  Filled 2024-07-19: qty 30, 30d supply, fill #1

## 2024-07-01 ENCOUNTER — Other Ambulatory Visit (HOSPITAL_BASED_OUTPATIENT_CLINIC_OR_DEPARTMENT_OTHER): Payer: Self-pay

## 2024-07-01 MED ORDER — NITROFURANTOIN MONOHYD MACRO 100 MG PO CAPS
100.0000 mg | ORAL_CAPSULE | Freq: Two times a day (BID) | ORAL | 0 refills | Status: AC
  Filled 2024-07-01: qty 20, 10d supply, fill #0

## 2024-08-14 ENCOUNTER — Ambulatory Visit: Admitting: Neurology

## 2024-08-14 ENCOUNTER — Telehealth: Payer: Self-pay | Admitting: Neurology

## 2024-08-14 NOTE — Telephone Encounter (Signed)
Appt cancelled due to inclement weather
